# Patient Record
Sex: Female | Born: 1950 | Race: White | Hispanic: No | Marital: Married | Smoking: Former smoker
Health system: Southern US, Community
[De-identification: ages and names within clinical notes are randomized; demographics above are authoritative.]

## PROBLEM LIST (undated history)

## (undated) DIAGNOSIS — E785 Hyperlipidemia, unspecified: Secondary | ICD-10-CM

## (undated) DIAGNOSIS — H269 Unspecified cataract: Secondary | ICD-10-CM

## (undated) DIAGNOSIS — T7840XA Allergy, unspecified, initial encounter: Secondary | ICD-10-CM

## (undated) DIAGNOSIS — M1990 Unspecified osteoarthritis, unspecified site: Secondary | ICD-10-CM

## (undated) HISTORY — DX: Allergy, unspecified, initial encounter: T78.40XA

## (undated) HISTORY — PX: ABDOMINAL HYSTERECTOMY: SHX81

## (undated) HISTORY — PX: TUBAL LIGATION: SHX77

## (undated) HISTORY — DX: Hyperlipidemia, unspecified: E78.5

## (undated) HISTORY — DX: Unspecified osteoarthritis, unspecified site: M19.90

## (undated) HISTORY — DX: Unspecified cataract: H26.9

---

## 1997-11-26 ENCOUNTER — Ambulatory Visit (HOSPITAL_COMMUNITY): Admission: RE | Admit: 1997-11-26 | Discharge: 1997-11-26 | Payer: Self-pay | Admitting: Family Medicine

## 1998-12-29 ENCOUNTER — Encounter: Payer: Self-pay | Admitting: Family Medicine

## 1998-12-29 ENCOUNTER — Ambulatory Visit (HOSPITAL_COMMUNITY): Admission: RE | Admit: 1998-12-29 | Discharge: 1998-12-29 | Payer: Self-pay | Admitting: Family Medicine

## 1999-10-09 ENCOUNTER — Encounter: Payer: Self-pay | Admitting: Neurosurgery

## 1999-10-09 ENCOUNTER — Ambulatory Visit (HOSPITAL_COMMUNITY): Admission: RE | Admit: 1999-10-09 | Discharge: 1999-10-09 | Payer: Self-pay | Admitting: Neurosurgery

## 1999-10-23 ENCOUNTER — Ambulatory Visit (HOSPITAL_COMMUNITY): Admission: RE | Admit: 1999-10-23 | Discharge: 1999-10-23 | Payer: Self-pay | Admitting: Neurosurgery

## 1999-10-23 ENCOUNTER — Encounter: Payer: Self-pay | Admitting: Neurosurgery

## 2000-02-09 ENCOUNTER — Ambulatory Visit (HOSPITAL_COMMUNITY): Admission: RE | Admit: 2000-02-09 | Discharge: 2000-02-09 | Payer: Self-pay | Admitting: Family Medicine

## 2000-02-09 ENCOUNTER — Encounter: Payer: Self-pay | Admitting: Family Medicine

## 2001-02-12 ENCOUNTER — Encounter: Payer: Self-pay | Admitting: Family Medicine

## 2001-02-12 ENCOUNTER — Ambulatory Visit (HOSPITAL_COMMUNITY): Admission: RE | Admit: 2001-02-12 | Discharge: 2001-02-12 | Payer: Self-pay | Admitting: Family Medicine

## 2007-08-28 ENCOUNTER — Ambulatory Visit (HOSPITAL_COMMUNITY): Admission: RE | Admit: 2007-08-28 | Discharge: 2007-08-28 | Payer: Self-pay | Admitting: Family Medicine

## 2008-09-01 ENCOUNTER — Ambulatory Visit (HOSPITAL_COMMUNITY): Admission: RE | Admit: 2008-09-01 | Discharge: 2008-09-01 | Payer: Self-pay | Admitting: Family Medicine

## 2009-01-05 ENCOUNTER — Encounter: Admission: RE | Admit: 2009-01-05 | Discharge: 2009-01-05 | Payer: Self-pay | Admitting: Family Medicine

## 2009-01-31 ENCOUNTER — Encounter: Admission: RE | Admit: 2009-01-31 | Discharge: 2009-02-25 | Payer: Self-pay | Admitting: Family Medicine

## 2009-05-16 ENCOUNTER — Encounter: Admission: RE | Admit: 2009-05-16 | Discharge: 2009-05-16 | Payer: Self-pay | Admitting: Family Medicine

## 2009-09-12 ENCOUNTER — Ambulatory Visit (HOSPITAL_COMMUNITY): Admission: RE | Admit: 2009-09-12 | Discharge: 2009-09-12 | Payer: Self-pay | Admitting: Family Medicine

## 2010-09-04 ENCOUNTER — Other Ambulatory Visit (HOSPITAL_COMMUNITY): Payer: Self-pay | Admitting: Family Medicine

## 2010-09-04 DIAGNOSIS — Z1231 Encounter for screening mammogram for malignant neoplasm of breast: Secondary | ICD-10-CM

## 2010-09-18 ENCOUNTER — Ambulatory Visit (HOSPITAL_COMMUNITY)
Admission: RE | Admit: 2010-09-18 | Discharge: 2010-09-18 | Disposition: A | Discharge status: Home / Self Care | Payer: Commercial Managed Care - PPO | Source: Ambulatory Visit | Attending: Family Medicine | Admitting: Family Medicine

## 2010-09-18 DIAGNOSIS — Z1231 Encounter for screening mammogram for malignant neoplasm of breast: Secondary | ICD-10-CM

## 2011-10-15 ENCOUNTER — Other Ambulatory Visit (HOSPITAL_COMMUNITY): Payer: Self-pay | Admitting: Family Medicine

## 2011-10-15 DIAGNOSIS — Z1231 Encounter for screening mammogram for malignant neoplasm of breast: Secondary | ICD-10-CM

## 2011-11-02 ENCOUNTER — Ambulatory Visit (HOSPITAL_COMMUNITY)
Admission: RE | Admit: 2011-11-02 | Discharge: 2011-11-02 | Disposition: A | Discharge status: Home / Self Care | Payer: Commercial Managed Care - PPO | Source: Ambulatory Visit | Attending: Family Medicine | Admitting: Family Medicine

## 2011-11-02 DIAGNOSIS — Z1231 Encounter for screening mammogram for malignant neoplasm of breast: Secondary | ICD-10-CM

## 2012-10-24 ENCOUNTER — Other Ambulatory Visit (HOSPITAL_COMMUNITY): Payer: Self-pay | Admitting: Family Medicine

## 2012-10-24 DIAGNOSIS — Z1231 Encounter for screening mammogram for malignant neoplasm of breast: Secondary | ICD-10-CM

## 2012-11-03 ENCOUNTER — Ambulatory Visit (HOSPITAL_COMMUNITY)
Admission: RE | Admit: 2012-11-03 | Discharge: 2012-11-03 | Disposition: A | Discharge status: Home / Self Care | Payer: Commercial Managed Care - PPO | Source: Ambulatory Visit | Attending: Family Medicine | Admitting: Family Medicine

## 2012-11-03 DIAGNOSIS — Z1231 Encounter for screening mammogram for malignant neoplasm of breast: Secondary | ICD-10-CM | POA: Insufficient documentation

## 2013-11-09 ENCOUNTER — Other Ambulatory Visit (HOSPITAL_COMMUNITY): Payer: Self-pay | Admitting: Family Medicine

## 2013-11-09 DIAGNOSIS — Z1231 Encounter for screening mammogram for malignant neoplasm of breast: Secondary | ICD-10-CM

## 2013-11-25 ENCOUNTER — Ambulatory Visit (HOSPITAL_COMMUNITY)
Admission: RE | Admit: 2013-11-25 | Discharge: 2013-11-25 | Disposition: A | Discharge status: Home / Self Care | Payer: Commercial Managed Care - PPO | Source: Ambulatory Visit | Attending: Family Medicine | Admitting: Family Medicine

## 2013-11-25 DIAGNOSIS — Z1231 Encounter for screening mammogram for malignant neoplasm of breast: Secondary | ICD-10-CM | POA: Diagnosis present

## 2014-04-01 ENCOUNTER — Ambulatory Visit: Payer: Commercial Managed Care - PPO | Attending: Family Medicine | Admitting: Physical Therapy

## 2014-04-01 DIAGNOSIS — M25561 Pain in right knee: Secondary | ICD-10-CM | POA: Insufficient documentation

## 2014-04-15 ENCOUNTER — Ambulatory Visit: Payer: Commercial Managed Care - PPO | Admitting: Physical Therapy

## 2014-04-15 DIAGNOSIS — M25561 Pain in right knee: Secondary | ICD-10-CM | POA: Diagnosis not present

## 2014-04-29 ENCOUNTER — Ambulatory Visit: Payer: Commercial Managed Care - PPO | Attending: Family Medicine | Admitting: Physical Therapy

## 2014-04-29 DIAGNOSIS — M25561 Pain in right knee: Secondary | ICD-10-CM | POA: Diagnosis present

## 2014-05-13 ENCOUNTER — Encounter: Payer: Self-pay | Admitting: Physical Therapy

## 2014-05-13 ENCOUNTER — Ambulatory Visit: Payer: Commercial Managed Care - PPO | Admitting: Physical Therapy

## 2014-05-13 DIAGNOSIS — M25561 Pain in right knee: Principal | ICD-10-CM

## 2014-05-13 DIAGNOSIS — G8929 Other chronic pain: Secondary | ICD-10-CM

## 2014-05-13 NOTE — Therapy (Addendum)
[facility] [address] [place], [place], [place], [address] Phone: [phone]   Fax:  [phone]  Physical Therapy Treatment  Patient Details  Name: Joy Murillo MRN: [id number] Date of Birth: 03/14/1951 Referring Provider:  Jonathon Bellows, MD  Encounter Date: 05/13/2014      PT End of Session - 05/13/14 0912    Visit Number 4   Number of Visits 17   Date for PT Re-Evaluation 05/27/14      History reviewed. No pertinent past medical history.  History reviewed. No pertinent past surgical history.  There were no vitals taken for this visit.  Visit Diagnosis:  Knee pain, chronic, right      Subjective Assessment - 05/13/14 0821    Symptoms Rfight knee pain   Limitations Standing;House hold activities   How long can you stand comfortably? 2 hours   How long can you walk comfortably? downhill, descending stairs   Currently in Pain? Yes   Pain Score 2    Pain Location Knee   Pain Orientation Right   Pain Descriptors / Indicators Sore   Pain Type Chronic pain   Pain Onset More than a month ago   Pain Frequency Intermittent   Aggravating Factors  descending stairs, walking downhill, cleaning the house, playing piano   Pain Relieving Factors changing position   Multiple Pain Sites No                    [facility] Adult PT Treatment/Exercise - 05/13/14 0001    Exercises   Exercises Knee/Hip   Knee/Hip Exercises: Stretches   Active Hamstring Stretch 3 reps;20 seconds   Knee/Hip Exercises: Aerobic   Stationary Bike 38mn  level 2, pt with slight incr. of discomfort at end of task   Knee/Hip Exercises: Standing   Knee Flexion AROM   Modalities   Modalities Ultrasound;Iontophoresis   Ultrasound   Ultrasound Location right knee   Ultrasound Parameters 20% x 8 min   Ultrasound Goals Pain   Iontophoresis   Type of Iontophoresis Dexamethasone   Location right knee   Dose 1cc   Time 6hour stat  patch   Manual Therapy   Manual Therapy Other (comment)  cross friction to right patellar tendon, and patella mob                PT Education - 05/13/14 0931    Education provided Yes          PT Short Term Goals - 05/13/14 0923    PT SHORT TERM GOAL #1   Title be independent with initial HEP for patella mobilization and flexibility exercises   Time 3   Period Weeks   Status Achieved   PT SHORT TERM GOAL #2   Title ambulate with pain decreased >/25%   Time 3   Period Weeks   Status On-going   PT SHORT TERM GOAL #3   Title kneeling with pain decreased >/=25%   Time 3   Period Weeks   Status On-going   PT SHORT TERM GOAL #4   Title walking down hill with pain decreased ./=25%   Time 3   Status On-going           PT Long Term Goals - 05/13/14 02778   PT LONG TERM GOAL #1   Title demonstrate and/or verbalize techniques to reduce the risk of re-injury to include info on:   Time 8   Period Weeks   Status  On-going   PT LONG TERM GOAL #2   Title be independent with advanced HEP for knee strengthening   Time 8   Period Weeks   Status On-going   PT LONG TERM GOAL #3   Title ambulate with pain decrease>=50%   Time 8   Period Weeks   Status On-going   PT LONG TERM GOAL #4   Title kneeling with pain decrease >/=50%   Time 8   Period Weeks   Status On-going   PT LONG TERM GOAL #5   Title walking down hill with pain decrease >/= 50%   Time 8   Period Weeks   Status On-going               Plan - 05/13/14 1586    Clinical Impression Statement patient will continue to benefit from skilled PT   Pt will benefit from skilled therapeutic intervention in order to improve on the following deficits Difficulty walking;Impaired flexibility;Pain;Decreased strength;Decreased activity tolerance   Rehab Potential Good   PT Frequency 2x / week   PT Duration 8 weeks   PT Treatment/Interventions Therapeutic activities;Patient/family education;Passive range of  motion;Therapeutic exercise;Ultrasound;Manual techniques;Stair training;Neuromuscular re-education   PT Next Visit Plan Quadriceps, Gastrocnemius, HS stretch, Ultrasound, Manuall therapy, endurance and strength, Ionto   Consulted and Agree with Plan of Care Patient        Problem List There are no active problems to display for this patient.   NAUMANN-HOUEGNIFIO,Nelton Amsden PTA 05/13/2014, 5:22 PM PHYSICAL THERAPY DISCHARGE SUMMARY  Visits from Start of Care: 4  Current functional level related to goals / functional outcomes: Pt attended 4 PT sessions and didn't return to PT.     Remaining deficits: Current status unknown.  See above for status 05/13/14.     Education / Equipment: HEP Plan: Patient agrees to discharge.  Patient goals were partially met. Patient is being discharged due to not returning since the last visit.  ?????    Sigurd Sos, Abby 02/15/2015 9:04 AM [facility] [address] [place], [place], [place], [address] Phone: [phone]   Fax:  [phone]

## 2014-05-13 NOTE — Patient Instructions (Signed)

## 2014-05-26 ENCOUNTER — Ambulatory Visit (INDEPENDENT_AMBULATORY_CARE_PROVIDER_SITE_OTHER): Payer: Commercial Managed Care - PPO

## 2014-05-26 ENCOUNTER — Ambulatory Visit (INDEPENDENT_AMBULATORY_CARE_PROVIDER_SITE_OTHER): Payer: Commercial Managed Care - PPO | Admitting: Family Medicine

## 2014-05-26 VITALS — BP 126/80 | HR 89 | Temp 98.0°F | Resp 17 | Ht 65.5 in | Wt 149.0 lb

## 2014-05-26 DIAGNOSIS — M25561 Pain in right knee: Secondary | ICD-10-CM

## 2014-05-26 MED ORDER — DICLOFENAC SODIUM 75 MG PO TBEC: 75.0000 mg | DELAYED_RELEASE_TABLET | Freq: Two times a day (BID) | ORAL | Status: DC

## 2014-05-26 NOTE — Patient Instructions (Signed)
I will get you set up for an MRI of your right knee asap.  When these results are in we will plan your next step.   Use the voltaren (NSAID) medication as needed for pain. You can also use tylenol.  Ice and elevate your leg, and use the crutches and brace as needed to take the pressure off of your knee.  Let me know if you have any other concerns in the meantime

## 2014-05-26 NOTE — Progress Notes (Signed)
[facility] [address], [place] [address] [phone]  Date:  05/26/2014   Name:  Joy Murillo   DOB:  01/26/1951   MRN:  [id number]  PCP:  Jonathon Bellows, MD    Chief Complaint: Knee Pain   History of Present Illness:  Joy Murillo is a 64 y.o. very pleasant female patient who presents with the following:  Generally healthy lady here as a new patient today.   Right before christmas she started having pain in her right knee with walking downhill.  After gym work-outs she would also have pain. This got worse with time. She saw her MD at [facility]; it sounds like she was dx with patellofemoral pain and started PT.   She then followed- up with her PCP.  She has an appt to see Dr. Everlene Farrier next week, but last night she was going down some steps and felt a sudden pain and a "snap."  Her knee has hurt a lot more since she injured it yesterday.  She is having a hard time walking.   It is a bit swollen.   She also has some back problems- her knee is making her back hurt more.   She took some advil last night  There are no active problems to display for this patient.   Past Medical History  Diagnosis Date  . Allergy     History reviewed. No pertinent past surgical history.  History  Substance Use Topics  . Smoking status: Former Research scientist (life sciences)  . Smokeless tobacco: Not on file  . Alcohol Use: No    Family History  Problem Relation Age of Onset  . Heart disease Mother   . Cancer Father   . Diabetes Father   . Heart disease Maternal Grandfather   . Cancer Paternal Grandmother   . Cancer Paternal Grandfather     Allergies  Allergen Reactions  . Adhesive [Tape]   . Flonase [Fluticasone Propionate] Hives    Medication list has been reviewed and updated.  Current Outpatient Prescriptions on File Prior to Visit  Medication Sig Dispense Refill  . estradiol (ESTRACE) 0.5 MG tablet Take 0.5 mg by mouth daily.     No current facility-administered  medications on file prior to visit.    Review of Systems:  As per HPI- otherwise negative.   Physical Examination: Filed Vitals:   05/26/14 0855  BP: 126/80  Pulse: 89  Temp: 98 F (36.7 C)  Resp: 17   Filed Vitals:   05/26/14 0855  Height: 5' 5.5" (1.664 m)  Weight: 149 lb (67.586 kg)   Body mass index is 24.41 kg/(m^2). Ideal Body Weight: Weight in (lb) to have BMI = 25: 152.2  GEN: WDWN, NAD, Non-toxic, A & O x 3, looks well HEENT: Atraumatic, Normocephalic. Neck supple. No masses, No LAD. Ears and Nose: No external deformity. CV: RRR, No M/G/R. No JVD. No thrill. No extra heart sounds. PULM: CTA B, no wheezes, crackles, rhonchi. No retractions. No resp. distress. No accessory muscle use.Marland Kitchen EXTR: No c/c/e NEURO favoring right leg slightly PSYCH: Normally interactive. Conversant. Not depressed or anxious appearing.  Calm demeanor.  Right knee: tender over the medial joint line.  No effusion,  crepitus with flexion and extension but full ROM.  Ligaments are stable, no heat or redness  [facility] reading (PRIMARY) by  Dr. Lorelei Pont. Right knee: medial compartment degenerative change, OW negative RIGHT KNEE - COMPLETE 4+ VIEW  COMPARISON: None.  FINDINGS: Mild  medial compartment and patellofemoral degenerative change. No evidence of fracture or dislocation. Knee joint effusion may be present.  IMPRESSION: 1. Right knee joint effusion.  2. Medial compartment and patellofemoral degenerative change. No acute bony abnormality.  Given crutches and a hinged knee brace- the brace felt good to her Assessment and Plan: Right knee pain - Plan: DG Knee Complete 4 Views Right, diclofenac (VOLTAREN) 75 MG EC tablet, MR Knee Right Wo Contrast  Knee pain for over 3 months.  She has tried conservative therapy/ PT but continues to get worse.  Suspect meniscal tear.  Will refer for MRI at this point, then will likely send to ortho.   voltaren as needed for pain, crutches and hinged  brace See patient instructions for more details.     Signed Lamar Blinks, MD

## 2014-05-27 ENCOUNTER — Encounter: Payer: Commercial Managed Care - PPO | Admitting: Physical Therapy

## 2014-05-28 ENCOUNTER — Telehealth: Payer: Self-pay | Admitting: Family Medicine

## 2014-05-28 DIAGNOSIS — M25561 Pain in right knee: Secondary | ICD-10-CM

## 2014-05-28 MED ORDER — TRAMADOL HCL 50 MG PO TABS: 50.0000 mg | ORAL_TABLET | Freq: Three times a day (TID) | ORAL | Status: DC | PRN

## 2014-05-28 NOTE — Telephone Encounter (Signed)
Patient is requesting Tramadol. She states that it was discussed during her OV. [place]   [phone]

## 2014-06-01 ENCOUNTER — Ambulatory Visit
Admission: RE | Admit: 2014-06-01 | Discharge: 2014-06-01 | Disposition: A | Discharge status: Home / Self Care | Payer: Commercial Managed Care - PPO | Source: Ambulatory Visit | Attending: Family Medicine | Admitting: Family Medicine

## 2014-06-01 ENCOUNTER — Telehealth: Payer: Self-pay | Admitting: Family Medicine

## 2014-06-01 ENCOUNTER — Ambulatory Visit: Payer: Self-pay | Admitting: Emergency Medicine

## 2014-06-01 DIAGNOSIS — M25561 Pain in right knee: Secondary | ICD-10-CM

## 2014-06-01 DIAGNOSIS — S83206A Unspecified tear of unspecified meniscus, current injury, right knee, initial encounter: Secondary | ICD-10-CM

## 2014-06-01 NOTE — Telephone Encounter (Signed)
Called her and discussed her MRI with her.  She does have a meniscal tear. Will refer to ortho- she has been seen at [facility] for this- she is ok with this plan.  She is able to get around ok with her hinged knee brace

## 2015-01-03 ENCOUNTER — Other Ambulatory Visit: Payer: Self-pay

## 2015-01-03 DIAGNOSIS — Z1231 Encounter for screening mammogram for malignant neoplasm of breast: Secondary | ICD-10-CM

## 2015-01-10 ENCOUNTER — Ambulatory Visit
Admission: RE | Admit: 2015-01-10 | Discharge: 2015-01-10 | Disposition: A | Discharge status: Home / Self Care | Payer: 59 | Source: Ambulatory Visit

## 2015-01-10 DIAGNOSIS — Z1231 Encounter for screening mammogram for malignant neoplasm of breast: Secondary | ICD-10-CM

## 2016-01-05 ENCOUNTER — Other Ambulatory Visit: Payer: Self-pay | Admitting: Family Medicine

## 2016-01-05 DIAGNOSIS — Z1231 Encounter for screening mammogram for malignant neoplasm of breast: Secondary | ICD-10-CM

## 2016-01-11 ENCOUNTER — Ambulatory Visit
Admission: RE | Admit: 2016-01-11 | Discharge: 2016-01-11 | Disposition: A | Discharge status: Home / Self Care | Payer: BLUE CROSS/BLUE SHIELD | Source: Ambulatory Visit | Attending: Family Medicine | Admitting: Family Medicine

## 2016-01-11 DIAGNOSIS — Z1231 Encounter for screening mammogram for malignant neoplasm of breast: Secondary | ICD-10-CM

## 2016-02-17 IMAGING — MR MR KNEE*R* W/O CM
4 of 6 series · 19 of 40 positions shown · non-contrast
Comparison: 05/26/2014

CLINICAL DATA: Right knee pain for several years but increased over
the past 3 months. Popping injury 1 week ago while walking down
stairs.

EXAM:
MRI OF THE RIGHT KNEE WITHOUT CONTRAST
TECHNIQUE: Multiplanar, multisequence MR imaging of the knee was performed. No
intravenous contrast was administered.

[Series 3: PD fat-sat · axial · 4.0mm · 0.29mm/px · z∈[-64,+66]mm · 8 of 27 slices shown (1 of 4)]
[im 1/27]
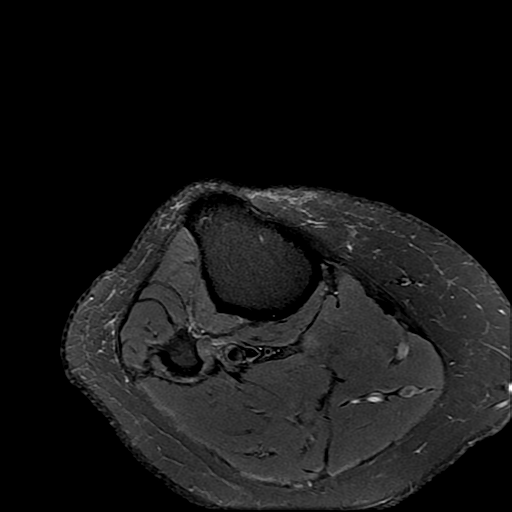
[im 4/27]
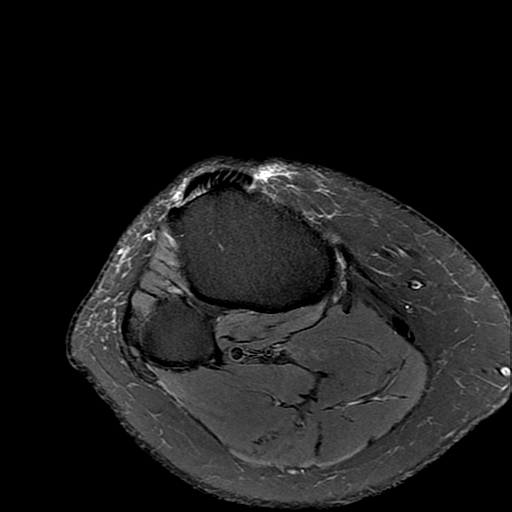
[im 8/27]
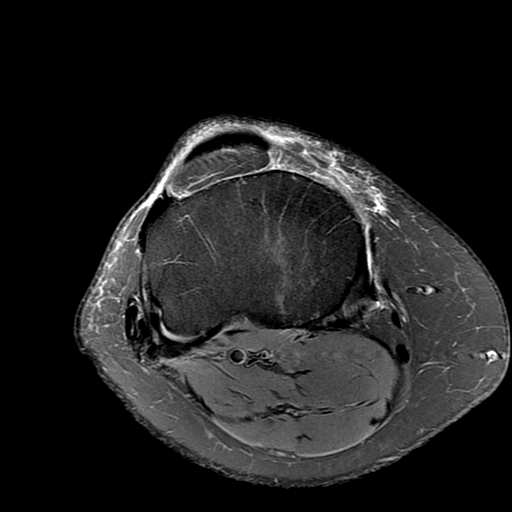
[im 12/27]
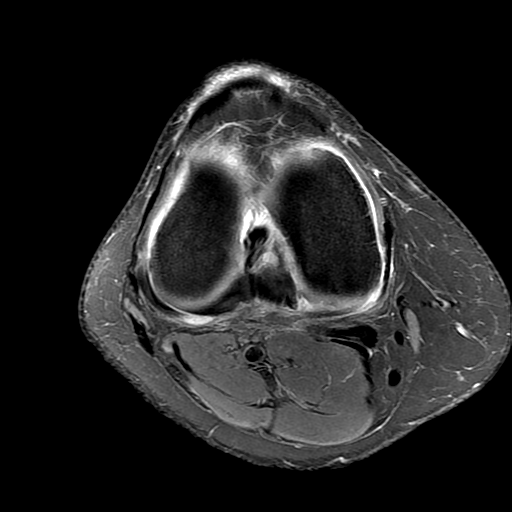
[im 15/27]
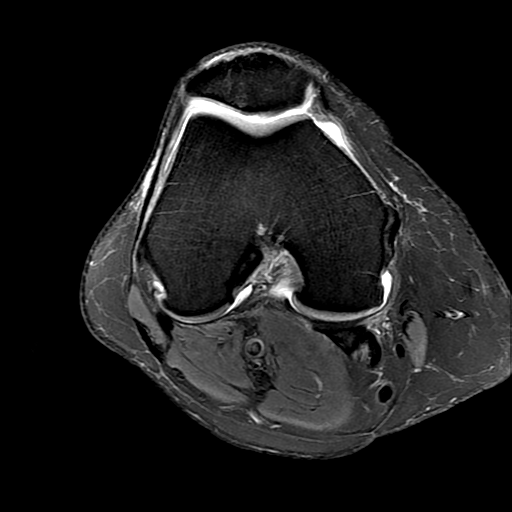
[im 19/27]
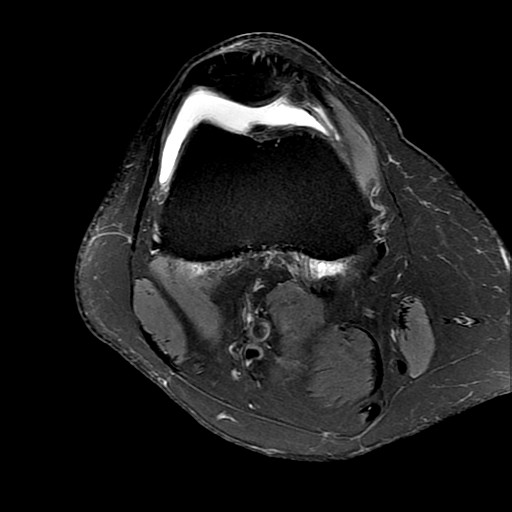
[im 23/27]
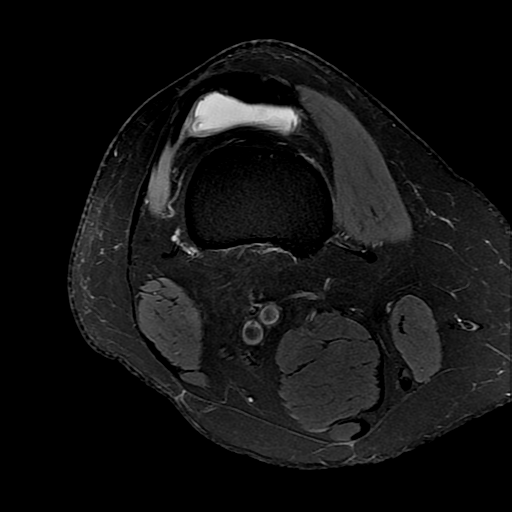
[im 27/27]
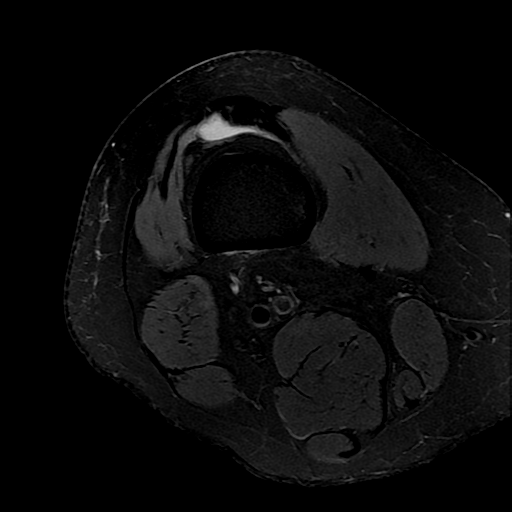

[Series 4: PD fat-sat · sagittal · 3.0mm · 0.27mm/px · 5 of 25 slices shown (2 of 4)]
[im 1/25]
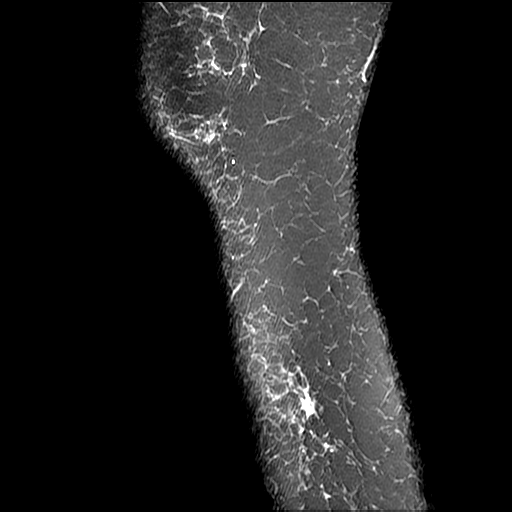
[im 5/25]
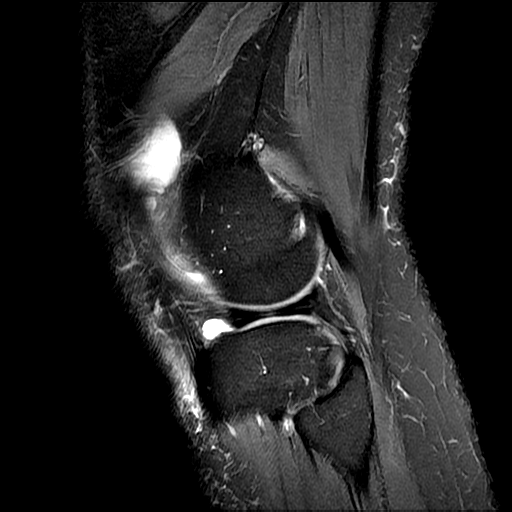
[im 9/25]
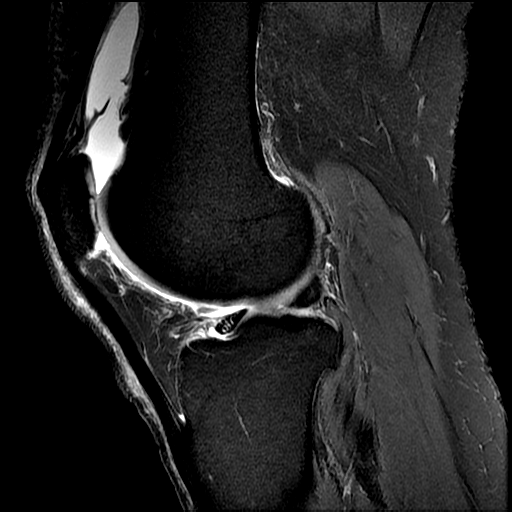
[im 13/25]
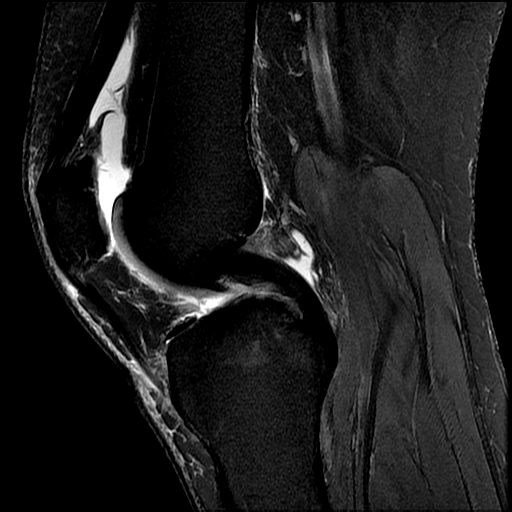
[im 21/25]
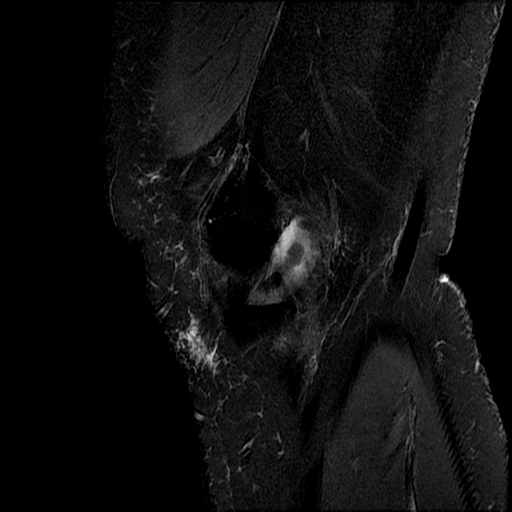

[Series 7: PD fat-sat · coronal · 4.0mm · 0.29mm/px · 3 of 23 slices shown (3 of 4)]
[im 4/23]
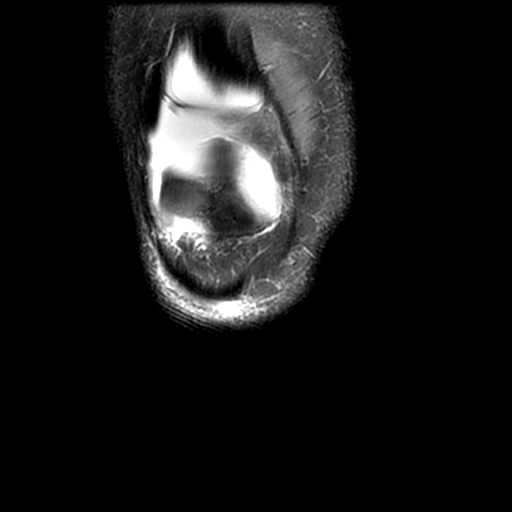
[im 12/23]
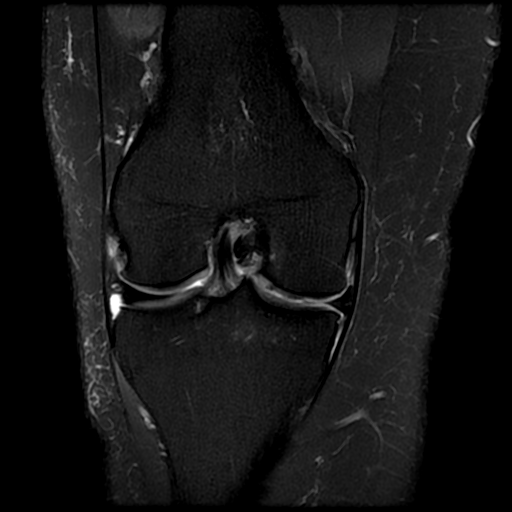
[im 19/23]
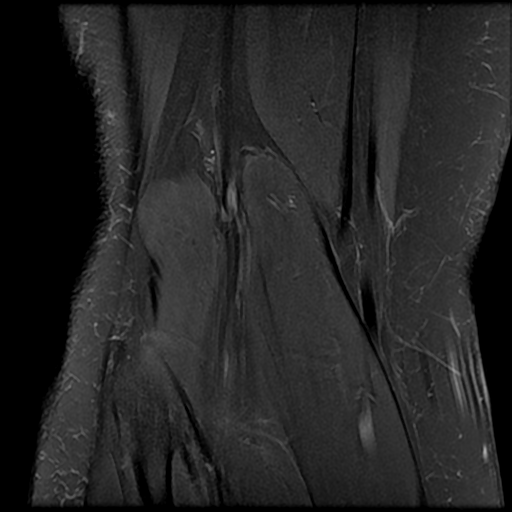

[Series 8: PD fat-sat · coronal · 2.0mm · 0.31mm/px · 3 of 13 slices shown (4 of 4)]
[im 1/13]
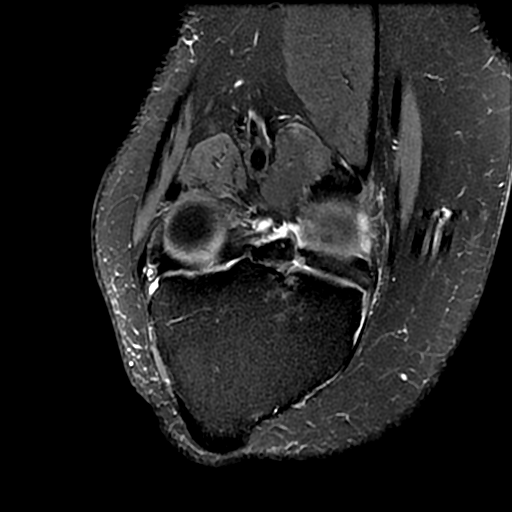
[im 9/13]
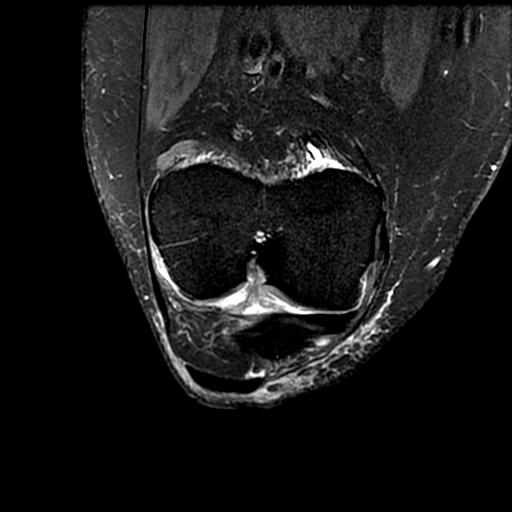
[im 13/13]
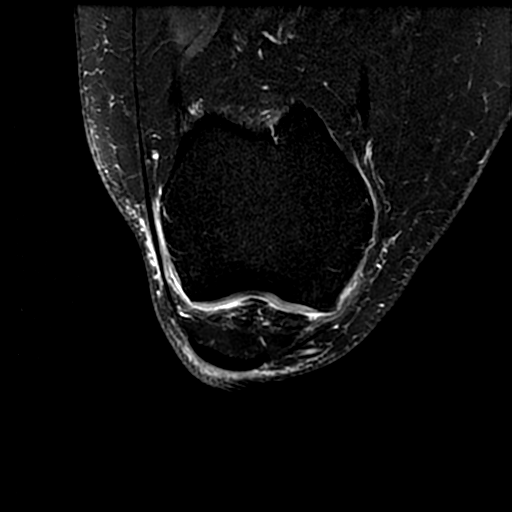

[19 of 40 positions shown; findings below may reference images not displayed]

FINDINGS: MENISCI

Medial meniscus:  Radial tear of the root of the posterior horn.

Lateral meniscus: Small radial tear in the posterior horn as on
image 8 of series 4, with a small free edge flap. Discoid variant.

LIGAMENTS

Cruciates:  Unremarkable

Collaterals:  Subtle edema tracks superficial to the MCL.

CARTILAGE

Patellofemoral: Nearly full-thickness chondral irregularity and
articular cartilage loss is specially along the posterior patellar
ridge, with moderate chondral thinning elsewhere along the facets.
There is only mild chondral thinning in the femoral trochlear
groove.

Medial: Moderate degenerative chondral thinning and considerable
chondral irregularity especially along the medial femoral condyle.

Lateral: Generally mild chondral thinning although there are some
moderate areas of chondral thinning posteriorly along the lateral
tibial plateau. Mild chondral irregularity.

Joint:  Moderate knee effusion.  Superior plica.

Popliteal Fossa:  Unremarkable

Extensor Mechanism:  Unremarkable

Bones:  Minimal marginal spurring in the medial compartment.
IMPRESSION: 1. Radial tear of the root of the posterior horn medial meniscus.
2. Small radial tear in the posterior horn lateral meniscus.
3. Potential grade 1 sprain of the MCL.
4. Varying degrees of chondral irregularity and chondral thinning,
most striking along the posterior patellar ridge and in the medial
compartment.
5. Moderate knee effusion with superior plica.

## 2016-12-10 ENCOUNTER — Other Ambulatory Visit: Payer: Self-pay | Admitting: Family Medicine

## 2016-12-10 DIAGNOSIS — Z1231 Encounter for screening mammogram for malignant neoplasm of breast: Secondary | ICD-10-CM

## 2017-01-14 ENCOUNTER — Ambulatory Visit
Admission: RE | Admit: 2017-01-14 | Discharge: 2017-01-14 | Disposition: A | Discharge status: Home / Self Care | Payer: BLUE CROSS/BLUE SHIELD | Source: Ambulatory Visit | Attending: Family Medicine | Admitting: Family Medicine

## 2017-01-14 DIAGNOSIS — Z1231 Encounter for screening mammogram for malignant neoplasm of breast: Secondary | ICD-10-CM

## 2017-12-05 ENCOUNTER — Other Ambulatory Visit: Payer: Self-pay | Admitting: Family Medicine

## 2017-12-05 DIAGNOSIS — Z1231 Encounter for screening mammogram for malignant neoplasm of breast: Secondary | ICD-10-CM

## 2018-01-17 ENCOUNTER — Ambulatory Visit
Admission: RE | Admit: 2018-01-17 | Discharge: 2018-01-17 | Disposition: A | Discharge status: Home / Self Care | Payer: BLUE CROSS/BLUE SHIELD | Source: Ambulatory Visit | Attending: Family Medicine | Admitting: Family Medicine

## 2018-01-17 DIAGNOSIS — Z1231 Encounter for screening mammogram for malignant neoplasm of breast: Secondary | ICD-10-CM

## 2018-03-12 DIAGNOSIS — J069 Acute upper respiratory infection, unspecified: Secondary | ICD-10-CM | POA: Diagnosis not present

## 2018-08-07 ENCOUNTER — Other Ambulatory Visit: Payer: Self-pay

## 2018-08-07 NOTE — Patient Outreach (Signed)
[facility]) Care Management  08/07/2018  Joy Murillo [birth year] [id number]   Health Risk Assessment (Engaged Patient) Health Risk Assessment screening completed by nursing staff. Patient referred to [facility] for outreach in 6 months.   PLAN Will follow up in 6 months.   [place] [phone]

## 2018-09-12 DIAGNOSIS — N949 Unspecified condition associated with female genital organs and menstrual cycle: Secondary | ICD-10-CM | POA: Diagnosis not present

## 2018-09-12 DIAGNOSIS — R399 Unspecified symptoms and signs involving the genitourinary system: Secondary | ICD-10-CM | POA: Diagnosis not present

## 2018-12-02 DIAGNOSIS — Z23 Encounter for immunization: Secondary | ICD-10-CM | POA: Diagnosis not present

## 2018-12-02 DIAGNOSIS — Z5181 Encounter for therapeutic drug level monitoring: Secondary | ICD-10-CM | POA: Diagnosis not present

## 2018-12-02 DIAGNOSIS — E785 Hyperlipidemia, unspecified: Secondary | ICD-10-CM | POA: Diagnosis not present

## 2018-12-02 DIAGNOSIS — Z0000 Encounter for general adult medical examination without abnormal findings: Secondary | ICD-10-CM | POA: Diagnosis not present

## 2018-12-05 DIAGNOSIS — Z23 Encounter for immunization: Secondary | ICD-10-CM | POA: Diagnosis not present

## 2018-12-29 DIAGNOSIS — L821 Other seborrheic keratosis: Secondary | ICD-10-CM | POA: Diagnosis not present

## 2018-12-29 DIAGNOSIS — D2262 Melanocytic nevi of left upper limb, including shoulder: Secondary | ICD-10-CM | POA: Diagnosis not present

## 2018-12-29 DIAGNOSIS — L814 Other melanin hyperpigmentation: Secondary | ICD-10-CM | POA: Diagnosis not present

## 2018-12-29 DIAGNOSIS — D225 Melanocytic nevi of trunk: Secondary | ICD-10-CM | POA: Diagnosis not present

## 2018-12-29 DIAGNOSIS — Z23 Encounter for immunization: Secondary | ICD-10-CM | POA: Diagnosis not present

## 2018-12-29 DIAGNOSIS — M7130 Other bursal cyst, unspecified site: Secondary | ICD-10-CM | POA: Diagnosis not present

## 2018-12-29 DIAGNOSIS — Z86018 Personal history of other benign neoplasm: Secondary | ICD-10-CM | POA: Diagnosis not present

## 2019-02-06 ENCOUNTER — Other Ambulatory Visit: Payer: Self-pay

## 2019-02-06 NOTE — Patient Outreach (Signed)
This encounter was created in error - please disregard.

## 2019-03-03 DIAGNOSIS — N958 Other specified menopausal and perimenopausal disorders: Secondary | ICD-10-CM | POA: Diagnosis not present

## 2019-03-03 DIAGNOSIS — R351 Nocturia: Secondary | ICD-10-CM | POA: Diagnosis not present

## 2019-03-03 DIAGNOSIS — N952 Postmenopausal atrophic vaginitis: Secondary | ICD-10-CM | POA: Diagnosis not present

## 2019-03-03 DIAGNOSIS — R82998 Other abnormal findings in urine: Secondary | ICD-10-CM | POA: Diagnosis not present

## 2019-04-02 ENCOUNTER — Other Ambulatory Visit: Payer: Self-pay | Admitting: Family Medicine

## 2019-04-02 DIAGNOSIS — Z1231 Encounter for screening mammogram for malignant neoplasm of breast: Secondary | ICD-10-CM

## 2019-04-07 ENCOUNTER — Other Ambulatory Visit: Payer: Self-pay

## 2019-04-07 ENCOUNTER — Ambulatory Visit
Admission: RE | Admit: 2019-04-07 | Discharge: 2019-04-07 | Disposition: A | Discharge status: Home / Self Care | Payer: PPO | Source: Ambulatory Visit

## 2019-04-07 DIAGNOSIS — Z1231 Encounter for screening mammogram for malignant neoplasm of breast: Secondary | ICD-10-CM | POA: Diagnosis not present

## 2019-04-22 DIAGNOSIS — N941 Unspecified dyspareunia: Secondary | ICD-10-CM | POA: Diagnosis not present

## 2019-04-22 DIAGNOSIS — R351 Nocturia: Secondary | ICD-10-CM | POA: Diagnosis not present

## 2019-04-22 DIAGNOSIS — N952 Postmenopausal atrophic vaginitis: Secondary | ICD-10-CM | POA: Diagnosis not present

## 2019-04-22 DIAGNOSIS — N958 Other specified menopausal and perimenopausal disorders: Secondary | ICD-10-CM | POA: Diagnosis not present

## 2019-10-02 DIAGNOSIS — H02889 Meibomian gland dysfunction of unspecified eye, unspecified eyelid: Secondary | ICD-10-CM | POA: Diagnosis not present

## 2019-10-02 DIAGNOSIS — H3582 Retinal ischemia: Secondary | ICD-10-CM | POA: Diagnosis not present

## 2019-10-02 DIAGNOSIS — H2513 Age-related nuclear cataract, bilateral: Secondary | ICD-10-CM | POA: Diagnosis not present

## 2019-12-10 DIAGNOSIS — M1990 Unspecified osteoarthritis, unspecified site: Secondary | ICD-10-CM | POA: Diagnosis not present

## 2019-12-10 DIAGNOSIS — J309 Allergic rhinitis, unspecified: Secondary | ICD-10-CM | POA: Diagnosis not present

## 2019-12-10 DIAGNOSIS — Z23 Encounter for immunization: Secondary | ICD-10-CM | POA: Diagnosis not present

## 2019-12-10 DIAGNOSIS — Z0000 Encounter for general adult medical examination without abnormal findings: Secondary | ICD-10-CM | POA: Diagnosis not present

## 2019-12-10 DIAGNOSIS — Z8719 Personal history of other diseases of the digestive system: Secondary | ICD-10-CM | POA: Diagnosis not present

## 2019-12-10 DIAGNOSIS — N952 Postmenopausal atrophic vaginitis: Secondary | ICD-10-CM | POA: Diagnosis not present

## 2019-12-10 DIAGNOSIS — K5289 Other specified noninfective gastroenteritis and colitis: Secondary | ICD-10-CM | POA: Diagnosis not present

## 2019-12-10 DIAGNOSIS — E739 Lactose intolerance, unspecified: Secondary | ICD-10-CM | POA: Diagnosis not present

## 2019-12-10 DIAGNOSIS — Z5181 Encounter for therapeutic drug level monitoring: Secondary | ICD-10-CM | POA: Diagnosis not present

## 2019-12-10 DIAGNOSIS — E785 Hyperlipidemia, unspecified: Secondary | ICD-10-CM | POA: Diagnosis not present

## 2020-01-07 DIAGNOSIS — Z1159 Encounter for screening for other viral diseases: Secondary | ICD-10-CM | POA: Diagnosis not present

## 2020-01-11 DIAGNOSIS — Z86018 Personal history of other benign neoplasm: Secondary | ICD-10-CM | POA: Diagnosis not present

## 2020-01-11 DIAGNOSIS — D225 Melanocytic nevi of trunk: Secondary | ICD-10-CM | POA: Diagnosis not present

## 2020-01-11 DIAGNOSIS — L814 Other melanin hyperpigmentation: Secondary | ICD-10-CM | POA: Diagnosis not present

## 2020-01-11 DIAGNOSIS — L578 Other skin changes due to chronic exposure to nonionizing radiation: Secondary | ICD-10-CM | POA: Diagnosis not present

## 2020-01-11 DIAGNOSIS — D2262 Melanocytic nevi of left upper limb, including shoulder: Secondary | ICD-10-CM | POA: Diagnosis not present

## 2020-01-11 DIAGNOSIS — L821 Other seborrheic keratosis: Secondary | ICD-10-CM | POA: Diagnosis not present

## 2020-01-13 DIAGNOSIS — K5730 Diverticulosis of large intestine without perforation or abscess without bleeding: Secondary | ICD-10-CM | POA: Diagnosis not present

## 2020-01-13 DIAGNOSIS — Z86010 Personal history of colonic polyps: Secondary | ICD-10-CM | POA: Diagnosis not present

## 2020-01-26 ENCOUNTER — Other Ambulatory Visit: Payer: Self-pay

## 2020-01-26 ENCOUNTER — Ambulatory Visit (INDEPENDENT_AMBULATORY_CARE_PROVIDER_SITE_OTHER): Payer: PPO

## 2020-01-26 ENCOUNTER — Ambulatory Visit: Payer: Self-pay

## 2020-01-26 ENCOUNTER — Ambulatory Visit: Payer: PPO | Admitting: Family Medicine

## 2020-01-26 ENCOUNTER — Encounter: Payer: Self-pay | Admitting: Family Medicine

## 2020-01-26 VITALS — BP 124/86 | HR 60 | Ht 65.5 in | Wt 144.0 lb

## 2020-01-26 DIAGNOSIS — M25561 Pain in right knee: Secondary | ICD-10-CM | POA: Diagnosis not present

## 2020-01-26 DIAGNOSIS — M1711 Unilateral primary osteoarthritis, right knee: Secondary | ICD-10-CM | POA: Diagnosis not present

## 2020-01-26 NOTE — Progress Notes (Signed)
[facility] [place] Phone: [phone] Subjective:   Fontaine No, am serving as a scribe for Dr. Hulan Saas. This visit occurred during the SARS-CoV-2 public health emergency.  Safety protocols were in place, including screening questions prior to the visit, additional usage of staff PPE, and extensive cleaning of exam room while observing appropriate contact time as indicated for disinfecting solutions.   I'm seeing this patient by the request  of:  Maurice Small, MD  CC: Right knee pain  [id number]  Joy Murillo is a 69 y.o. female coming in with complaint of right anterior knee pain for years. Patient uses knee sleeve when walking. At night she props knee on pillow. Patient will have pain with kneeling on knee. History of arthroscopic surgery 2016.   MRI right knee 2016 IMPRESSION: 1. Radial tear of the root of the posterior horn medial meniscus. 2. Small radial tear in the posterior horn lateral meniscus. 3. Potential grade 1 sprain of the MCL. 4. Varying degrees of chondral irregularity and chondral thinning, most striking along the posterior patellar ridge and in the medial compartment. 5. Moderate knee effusion with superior plica.       Past Medical History:  Diagnosis Date  . Allergy    No past surgical history on file. Social History   Socioeconomic History  . Marital status: Married    Spouse name: Not on file  . Number of children: Not on file  . Years of education: Not on file  . Highest education level: Not on file  Occupational History  . Not on file  Tobacco Use  . Smoking status: Former Smoker  Substance and Sexual Activity  . Alcohol use: No  . Drug use: No  . Sexual activity: Never    Birth control/protection: Abstinence  Other Topics Concern  . Not on file  Social History Narrative  . Not on file   Social Determinants of Health   Financial Resource Strain:    . Difficulty of Paying Living Expenses: Not on file  Food Insecurity:   . Worried About Charity fundraiser in the Last Year: Not on file  . Ran Out of Food in the Last Year: Not on file  Transportation Needs:   . Lack of Transportation (Medical): Not on file  . Lack of Transportation (Non-Medical): Not on file  Physical Activity:   . Days of Exercise per Week: Not on file  . Minutes of Exercise per Session: Not on file  Stress:   . Feeling of Stress : Not on file  Social Connections:   . Frequency of Communication with Friends and Family: Not on file  . Frequency of Social Gatherings with Friends and Family: Not on file  . Attends Religious Services: Not on file  . Active Member of Clubs or Organizations: Not on file  . Attends Archivist Meetings: Not on file  . Marital Status: Not on file   Allergies  Allergen Reactions  . Adhesive [Tape]   . Flonase [Fluticasone Propionate] Hives   Family History  Problem Relation Age of Onset  . Heart disease Mother   . Cancer Father   . Diabetes Father   . Heart disease Maternal Grandfather   . Cancer Paternal Grandmother   . Cancer Paternal Grandfather   . Breast cancer Neg Hx     Current Outpatient Medications (Endocrine & Metabolic):  .  estradiol (ESTRACE) 0.5 MG tablet, Take 0.5  mg by mouth daily.    Current Outpatient Medications (Analgesics):  .  aspirin 81 MG tablet, Take 81 mg by mouth daily. .  diclofenac (VOLTAREN) 75 MG EC tablet, Take 1 tablet (75 mg total) by mouth 2 (two) times daily. Use as needed for knee pain .  traMADol (ULTRAM) 50 MG tablet, Take 1 tablet (50 mg total) by mouth every 8 (eight) hours as needed.   Current Outpatient Medications (Other):  Marland Kitchen  Multiple Vitamins-Minerals (MULTIVITAMIN WITH MINERALS) tablet, Take 1 tablet by mouth daily.   Reviewed prior external information including notes and imaging from  primary care provider As well as notes that were available from care  everywhere and other healthcare systems.  Past medical history, social, surgical and family history all reviewed in electronic medical record.  No pertanent information unless stated regarding to the chief complaint.   Review of Systems:  No headache, visual changes, nausea, vomiting, diarrhea, constipation, dizziness, abdominal pain, skin rash, fevers, chills, night sweats, weight loss, swollen lymph nodes, body aches, joint swelling, chest pain, shortness of breath, mood changes. POSITIVE muscle aches  Objective  Blood pressure 124/86, pulse 60, height 5' 5.5" (1.664 m), weight 144 lb (65.3 kg), SpO2 99 %.   General: No apparent distress alert and oriented x3 mood and affect normal, dressed appropriately.  HEENT: Pupils equal, extraocular movements intact  Respiratory: Patient's speak in full sentences and does not appear short of breath  Cardiovascular: No lower extremity edema, non tender, no erythema  Right knee exam shows the patient does have some mild lateral tracking of the patella noted.  Mild positive patellar grind.  No significant instability of the knee noted though.  Very mild pain over the medial joint space.  All ligaments appear to be intact.  Limited musculoskeletal ultrasound was performed and interpreted by Lyndal Pulley Limited ultrasound of patient's right knee shows the patient does have moderate narrowing of the patellofemoral joint noted.  Trace effusion noted at this place.  Patient does have the postsurgical changes of the medial meniscus posteriorly.  Patient does have some degenerative changes of the meniscus still left.  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  This included exercises focusing on stretching, strengthening, with significant focus on eccentric aspects.   Long  term goals include an improvement in range of motion, strength, endurance as well as avoiding reinjury. Patient's frequency would include in 1-2 times a day, 3-5 times a week for a duration of 6-12 weeks. Reviewed anatomy using anatomical model and how PFS occurs.  Given rehab exercises handout for VMO, hip abductors, core, entire kinetic chain including proprioception exercises.  Could benefit from PT, regular exercise, upright biking, and a PFS knee brace to assist with tracking abnormalities.  Proper technique shown and discussed handout in great detail with ATC.  All questions were discussed and answered.     Impression and Recommendations:     The above documentation has been reviewed and is accurate and complete Lyndal Pulley, DO

## 2020-01-26 NOTE — Assessment & Plan Note (Signed)
Patient does have what appears to be more patellofemoral arthritis, x-rays pending.  Patient has not taken significant number of medications we discussed vitamin supplementation.  Icing regimen.  Tru pull lite.  Patient will follow up again in 6 weeks worsening pain consider injections or physical therapy

## 2020-01-26 NOTE — Patient Instructions (Addendum)
Xray today Knee brace with activity Good to see you.  Ice 20 minutes 2 times daily. Usually after activity and before bed. Turmeric 500mg  daily  Tart cherry extract 1200mg  at night Vitamin D 2000 IU daily  See me again in 6 weeks

## 2020-03-08 ENCOUNTER — Other Ambulatory Visit: Payer: Self-pay

## 2020-03-08 ENCOUNTER — Ambulatory Visit: Payer: PPO | Admitting: Family Medicine

## 2020-03-08 ENCOUNTER — Encounter: Payer: Self-pay | Admitting: Family Medicine

## 2020-03-08 DIAGNOSIS — M1711 Unilateral primary osteoarthritis, right knee: Secondary | ICD-10-CM

## 2020-03-08 NOTE — Patient Instructions (Signed)
Good to see you We have many modalities at our disposal including PT and injections After discussing home exercises for now until your festivals See me again in 3-4 months

## 2020-03-08 NOTE — Progress Notes (Signed)
[place] [address] [place] Phone: [phone] Subjective:   I Joy Murillo am serving as a Education administrator for Dr. Hulan Murillo.  This visit occurred during the SARS-CoV-2 public health emergency.  Safety protocols were in place, including screening questions prior to the visit, additional usage of staff PPE, and extensive cleaning of exam room while observing appropriate contact time as indicated for disinfecting solutions.   I'm seeing this patient by the request  of:  Joy Small, MD  CC: Knee pain follow-up  MHD:[id number]   11/52/2021 Patient does have what appears to be more patellofemoral arthritis, x-rays pending.  Patient has not taken significant number of medications we discussed vitamin supplementation.  Icing regimen.  Tru pull lite.  Patient will follow up again in 6 weeks worsening pain consider injections or physical therapy   Update 03/08/2020 Joy Murillo is a 69 y.o. female coming in with complaint of right knee pain. Patient states she is doing well. States she really likes the brace. Believes she feels the same but the brace helps. Wants to know what to do to keep things in a good place.  Patient states that she is going to be very busy here.  We will try to do the exercises fairly regularly.  Still notices that stairs seems to be the most concerning portion.  Feels like she has more pain on the anterior aspect of the knee with stairs    Patient did have x-rays at last exam.  X-rays were independently visualized by me showing the patient does have mild to moderate osteoarthritic changes of the medial compartment as well as the patellofemoral.  Past Medical History:  Diagnosis Date  . Allergy    No past surgical history on file. Social History   Socioeconomic History  . Marital status: Married    Spouse name: Not on file  . Number of children: Not on file  . Years of education: Not on file  . Highest  education level: Not on file  Occupational History  . Not on file  Tobacco Use  . Smoking status: Former Research scientist (life sciences)  . Smokeless tobacco: Not on file  Substance and Sexual Activity  . Alcohol use: No  . Drug use: No  . Sexual activity: Never    Birth control/protection: Abstinence  Other Topics Concern  . Not on file  Social History Narrative  . Not on file   Social Determinants of Health   Financial Resource Strain: Not on file  Food Insecurity: Not on file  Transportation Needs: Not on file  Physical Activity: Not on file  Stress: Not on file  Social Connections: Not on file   Allergies  Allergen Reactions  . Adhesive [Tape]   . Flonase [Fluticasone Propionate] Hives   Family History  Problem Relation Age of Onset  . Heart disease Mother   . Cancer Father   . Diabetes Father   . Heart disease Maternal Grandfather   . Cancer Paternal Grandmother   . Cancer Paternal Grandfather   . Breast cancer Neg Hx     Current Outpatient Medications (Endocrine & Metabolic):  .  estradiol (ESTRACE) 0.5 MG tablet, Take 0.5 mg by mouth daily.    Current Outpatient Medications (Analgesics):  .  aspirin 81 MG tablet, Take 81 mg by mouth daily.   Current Outpatient Medications (Other):  Marland Kitchen  Multiple Vitamins-Minerals (MULTIVITAMIN WITH MINERALS) tablet, Take 1 tablet by mouth daily.   Reviewed prior external information  including notes and imaging from  primary care provider As well as notes that were available from care everywhere and other healthcare systems.  Past medical history, social, surgical and family history all reviewed in electronic medical record.  No pertanent information unless stated regarding to the chief complaint.   Review of Systems:  No headache, visual changes, nausea, vomiting, diarrhea, constipation, dizziness, abdominal pain, skin rash, fevers, chills, night sweats, weight loss, swollen lymph nodes, body aches, joint swelling, chest pain, shortness of  breath, mood changes. POSITIVE muscle aches  Objective  Blood pressure 110/70, pulse 67, height 5' 5.5" (1.664 m), weight 142 lb (64.4 kg), SpO2 98 %.   General: No apparent distress alert and oriented x3 mood and affect normal, dressed appropriately.  HEENT: Pupils equal, extraocular movements intact  Respiratory: Patient's speak in full sentences and does not appear short of breath  Cardiovascular: No lower extremity edema, non tender, no erythema  Gait mild antalgic MSK: Knee: Right valgus deformity noted.  Tender to palpation over medial and PF joint line.  ROM full in flexion and extension and lower leg rotation. painful patellar compression. Patellar glide with mild crepitus. Patellar and quadriceps tendons unremarkable. Hamstring and quadriceps strength is normal. Contralateral knee shows minimal arthritic changes    Impression and Recommendations:     The above documentation has been reviewed and is accurate and complete Joy Pulley, DO

## 2020-03-08 NOTE — Assessment & Plan Note (Signed)
Patient does have patellofemoral as well as medial joint arthritis.  We discussed with patient in great length about different treatment options.  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  Patient at this point would like to continue the conservative therapy.  Declined injection physical therapy or even advanced imaging at this point.  Patient would not want any surgical intervention.  Patient will continue the brace, home exercises and icing regimen follow-up with me again in 3 to 4 months

## 2020-04-19 ENCOUNTER — Other Ambulatory Visit: Payer: Self-pay | Admitting: Family Medicine

## 2020-04-19 DIAGNOSIS — Z1231 Encounter for screening mammogram for malignant neoplasm of breast: Secondary | ICD-10-CM

## 2020-04-21 ENCOUNTER — Other Ambulatory Visit: Payer: Self-pay

## 2020-04-21 ENCOUNTER — Ambulatory Visit
Admission: RE | Admit: 2020-04-21 | Discharge: 2020-04-21 | Disposition: A | Discharge status: Home / Self Care | Payer: PPO | Source: Ambulatory Visit

## 2020-04-21 DIAGNOSIS — Z1231 Encounter for screening mammogram for malignant neoplasm of breast: Secondary | ICD-10-CM | POA: Diagnosis not present

## 2020-05-04 DIAGNOSIS — U071 COVID-19: Secondary | ICD-10-CM | POA: Diagnosis not present

## 2020-05-27 DIAGNOSIS — J0100 Acute maxillary sinusitis, unspecified: Secondary | ICD-10-CM | POA: Diagnosis not present

## 2020-10-17 DIAGNOSIS — H43812 Vitreous degeneration, left eye: Secondary | ICD-10-CM | POA: Diagnosis not present

## 2020-10-17 DIAGNOSIS — H2513 Age-related nuclear cataract, bilateral: Secondary | ICD-10-CM | POA: Diagnosis not present

## 2021-01-12 DIAGNOSIS — L578 Other skin changes due to chronic exposure to nonionizing radiation: Secondary | ICD-10-CM | POA: Diagnosis not present

## 2021-01-12 DIAGNOSIS — L814 Other melanin hyperpigmentation: Secondary | ICD-10-CM | POA: Diagnosis not present

## 2021-01-12 DIAGNOSIS — L821 Other seborrheic keratosis: Secondary | ICD-10-CM | POA: Diagnosis not present

## 2021-01-12 DIAGNOSIS — D225 Melanocytic nevi of trunk: Secondary | ICD-10-CM | POA: Diagnosis not present

## 2021-01-12 DIAGNOSIS — Z86018 Personal history of other benign neoplasm: Secondary | ICD-10-CM | POA: Diagnosis not present

## 2021-01-12 DIAGNOSIS — T148XXA Other injury of unspecified body region, initial encounter: Secondary | ICD-10-CM | POA: Diagnosis not present

## 2021-01-12 DIAGNOSIS — D2262 Melanocytic nevi of left upper limb, including shoulder: Secondary | ICD-10-CM | POA: Diagnosis not present

## 2021-01-12 DIAGNOSIS — Z23 Encounter for immunization: Secondary | ICD-10-CM | POA: Diagnosis not present

## 2021-01-16 DIAGNOSIS — Z0000 Encounter for general adult medical examination without abnormal findings: Secondary | ICD-10-CM | POA: Diagnosis not present

## 2021-01-16 DIAGNOSIS — Z136 Encounter for screening for cardiovascular disorders: Secondary | ICD-10-CM | POA: Diagnosis not present

## 2021-01-16 DIAGNOSIS — Z1211 Encounter for screening for malignant neoplasm of colon: Secondary | ICD-10-CM | POA: Diagnosis not present

## 2021-01-16 DIAGNOSIS — R030 Elevated blood-pressure reading, without diagnosis of hypertension: Secondary | ICD-10-CM | POA: Diagnosis not present

## 2021-01-16 DIAGNOSIS — Z13820 Encounter for screening for osteoporosis: Secondary | ICD-10-CM | POA: Diagnosis not present

## 2021-01-16 DIAGNOSIS — Z1231 Encounter for screening mammogram for malignant neoplasm of breast: Secondary | ICD-10-CM | POA: Diagnosis not present

## 2021-01-16 DIAGNOSIS — N951 Menopausal and female climacteric states: Secondary | ICD-10-CM | POA: Diagnosis not present

## 2021-01-16 DIAGNOSIS — J302 Other seasonal allergic rhinitis: Secondary | ICD-10-CM | POA: Diagnosis not present

## 2021-01-16 DIAGNOSIS — Z13220 Encounter for screening for lipoid disorders: Secondary | ICD-10-CM | POA: Diagnosis not present

## 2021-01-25 ENCOUNTER — Other Ambulatory Visit: Payer: Self-pay | Admitting: Family Medicine

## 2021-01-25 DIAGNOSIS — Z13820 Encounter for screening for osteoporosis: Secondary | ICD-10-CM

## 2021-01-25 DIAGNOSIS — Z1231 Encounter for screening mammogram for malignant neoplasm of breast: Secondary | ICD-10-CM

## 2021-07-13 ENCOUNTER — Ambulatory Visit
Admission: RE | Admit: 2021-07-13 | Discharge: 2021-07-13 | Disposition: A | Discharge status: Home / Self Care | Payer: PPO | Source: Ambulatory Visit | Attending: Family Medicine | Admitting: Family Medicine

## 2021-07-13 DIAGNOSIS — Z13820 Encounter for screening for osteoporosis: Secondary | ICD-10-CM

## 2021-07-13 DIAGNOSIS — M85832 Other specified disorders of bone density and structure, left forearm: Secondary | ICD-10-CM | POA: Diagnosis not present

## 2021-07-13 DIAGNOSIS — Z780 Asymptomatic menopausal state: Secondary | ICD-10-CM | POA: Diagnosis not present

## 2021-07-13 DIAGNOSIS — Z1231 Encounter for screening mammogram for malignant neoplasm of breast: Secondary | ICD-10-CM

## 2021-10-04 NOTE — Progress Notes (Unsigned)
    Joy Murillo D.[place] Phone: [phone]   Assessment and Plan:     There are no diagnoses linked to this encounter.  ***   Pertinent previous records reviewed include ***   Follow Up: ***     Subjective:   I, Joy Murillo, am serving as a Education administrator for Doctor Glennon Mac  Chief Complaint: left knee pain   HPI:   10/05/2021 Patient is a 71 year old female complaining of left knee pain. Patient states   Relevant Historical Information: ***  Additional pertinent review of systems negative.   Current Outpatient Medications:    aspirin 81 MG tablet, Take 81 mg by mouth daily., Disp: , Rfl:    estradiol (ESTRACE) 0.5 MG tablet, Take 0.5 mg by mouth daily., Disp: , Rfl:    Multiple Vitamins-Minerals (MULTIVITAMIN WITH MINERALS) tablet, Take 1 tablet by mouth daily., Disp: , Rfl:    Objective:     There were no vitals filed for this visit.    There is no height or weight on file to calculate BMI.    Physical Exam:    ***   Electronically signed by:  Joy Murillo D.[facility] 2:07 PM 10/04/21

## 2021-10-05 ENCOUNTER — Ambulatory Visit: Payer: PPO | Admitting: Sports Medicine

## 2021-10-05 ENCOUNTER — Ambulatory Visit (INDEPENDENT_AMBULATORY_CARE_PROVIDER_SITE_OTHER): Payer: PPO

## 2021-10-05 VITALS — BP 118/70 | HR 66 | Ht 65.0 in | Wt 140.0 lb

## 2021-10-05 DIAGNOSIS — M1712 Unilateral primary osteoarthritis, left knee: Secondary | ICD-10-CM | POA: Diagnosis not present

## 2021-10-05 DIAGNOSIS — G8929 Other chronic pain: Secondary | ICD-10-CM

## 2021-10-05 DIAGNOSIS — M25562 Pain in left knee: Secondary | ICD-10-CM

## 2021-10-05 NOTE — Patient Instructions (Addendum)
Good to see you  Knee HEP  4 week follow up   

## 2021-10-23 DIAGNOSIS — H2513 Age-related nuclear cataract, bilateral: Secondary | ICD-10-CM | POA: Diagnosis not present

## 2021-11-01 NOTE — Progress Notes (Signed)
    Joy Murillo D.[place] Phone: [phone]   Assessment and Plan:     1. Chronic pain of left knee 2. Primary osteoarthritis of left knee  -Chronic with exacerbation, subsequent visit - Significant improvement in flare of left knee pain with underlying MOI after CSI at previous office visit - Encouraged to continue HEP - Tylenol/NSAIDs as needed for day-to-day pain relief  Pertinent previous records reviewed include none   Follow Up: As needed if no improvement or worsening of symptoms.  Could repeat CSI if flare recurs.  Would ideally wait at least 3 months in between injections and could be repeated after 01/05/2022   Subjective:   I, Joy Murillo, am serving as a Education administrator for Doctor Glennon Mac   Chief Complaint: left knee pain    HPI:    10/05/2021 Patient is a 71 year old female complaining of left knee pain. Patient states that she has no MOI, bending causes pain, she is able to straighten her knee the day before yesterday it hurt to walk, just been going on since 09/23/2021, has numbness and tingling but that comes from her low back , has been taking advil and that is okay, tries to avoid meds do to kidney numbers, sometimes she does get locking clicking and popping   11/09/2021 Patient states the [place] worked really well     Relevant Historical Information: None pertinent   Additional pertinent review of systems negative.    Current Outpatient Medications:    aspirin 81 MG tablet, Take 81 mg by mouth daily., Disp: , Rfl:    estradiol (ESTRACE) 0.5 MG tablet, Take 0.5 mg by mouth daily., Disp: , Rfl:    Multiple Vitamins-Minerals (MULTIVITAMIN WITH MINERALS) tablet, Take 1 tablet by mouth daily., Disp: , Rfl:    Objective:     Vitals:   11/09/21 0817  BP: 118/74  Pulse: 60  SpO2: 100%  Weight: 140 lb (63.5 kg)  Height: '5\' 5"'$  (1.651 m)      Body mass index is 23.3 kg/m.     Physical Exam:    General:  awake, alert oriented, no acute distress nontoxic Skin: no suspicious lesions or rashes Neuro:sensation intact, no deficits, strength 5/5 with no deficits, no atrophy, normal muscle tone Psych: No signs of anxiety, depression or other mood disorder  Left knee: No swelling No deformity Neg fluid wave, joint milking ROM Flex 110 , Ext 0  NTTP over the quad tendon, medial fem condyle, lat fem condyle, patella, plica, patella tendon, tibial tuberostiy, fibular head, posterior fossa, pes anserine bursa, gerdy's tubercle, medial jt line, lateral jt line    Gait normal    Electronically signed by:  Joy Murillo D.[facility] 8:36 AM 11/09/21

## 2021-11-09 ENCOUNTER — Ambulatory Visit: Payer: PPO | Admitting: Sports Medicine

## 2021-11-09 VITALS — BP 118/74 | HR 60 | Ht 65.0 in | Wt 140.0 lb

## 2021-11-09 DIAGNOSIS — M1712 Unilateral primary osteoarthritis, left knee: Secondary | ICD-10-CM | POA: Diagnosis not present

## 2021-11-09 DIAGNOSIS — G8929 Other chronic pain: Secondary | ICD-10-CM | POA: Diagnosis not present

## 2021-11-09 DIAGNOSIS — M25562 Pain in left knee: Secondary | ICD-10-CM | POA: Diagnosis not present

## 2021-11-09 NOTE — Patient Instructions (Signed)
Good to see you   

## 2022-01-17 DIAGNOSIS — Z1231 Encounter for screening mammogram for malignant neoplasm of breast: Secondary | ICD-10-CM | POA: Diagnosis not present

## 2022-01-17 DIAGNOSIS — Z7185 Encounter for immunization safety counseling: Secondary | ICD-10-CM | POA: Diagnosis not present

## 2022-01-17 DIAGNOSIS — M8589 Other specified disorders of bone density and structure, multiple sites: Secondary | ICD-10-CM | POA: Diagnosis not present

## 2022-01-17 DIAGNOSIS — Z0000 Encounter for general adult medical examination without abnormal findings: Secondary | ICD-10-CM | POA: Diagnosis not present

## 2022-01-17 DIAGNOSIS — Z8719 Personal history of other diseases of the digestive system: Secondary | ICD-10-CM | POA: Diagnosis not present

## 2022-01-17 DIAGNOSIS — Z86010 Personal history of colonic polyps: Secondary | ICD-10-CM | POA: Diagnosis not present

## 2022-01-17 DIAGNOSIS — N952 Postmenopausal atrophic vaginitis: Secondary | ICD-10-CM | POA: Diagnosis not present

## 2022-01-17 DIAGNOSIS — M1712 Unilateral primary osteoarthritis, left knee: Secondary | ICD-10-CM | POA: Diagnosis not present

## 2022-01-17 DIAGNOSIS — E785 Hyperlipidemia, unspecified: Secondary | ICD-10-CM | POA: Diagnosis not present

## 2022-01-17 DIAGNOSIS — Z1211 Encounter for screening for malignant neoplasm of colon: Secondary | ICD-10-CM | POA: Diagnosis not present

## 2022-01-29 DIAGNOSIS — D2262 Melanocytic nevi of left upper limb, including shoulder: Secondary | ICD-10-CM | POA: Diagnosis not present

## 2022-01-29 DIAGNOSIS — D225 Melanocytic nevi of trunk: Secondary | ICD-10-CM | POA: Diagnosis not present

## 2022-01-29 DIAGNOSIS — Z86018 Personal history of other benign neoplasm: Secondary | ICD-10-CM | POA: Diagnosis not present

## 2022-01-29 DIAGNOSIS — L821 Other seborrheic keratosis: Secondary | ICD-10-CM | POA: Diagnosis not present

## 2022-01-29 DIAGNOSIS — L814 Other melanin hyperpigmentation: Secondary | ICD-10-CM | POA: Diagnosis not present

## 2022-01-29 DIAGNOSIS — L578 Other skin changes due to chronic exposure to nonionizing radiation: Secondary | ICD-10-CM | POA: Diagnosis not present

## 2022-07-03 ENCOUNTER — Other Ambulatory Visit: Payer: Self-pay | Admitting: Family Medicine

## 2022-07-03 DIAGNOSIS — Z1231 Encounter for screening mammogram for malignant neoplasm of breast: Secondary | ICD-10-CM

## 2022-08-16 ENCOUNTER — Ambulatory Visit
Admission: RE | Admit: 2022-08-16 | Discharge: 2022-08-16 | Disposition: A | Discharge status: Home / Self Care | Payer: PPO | Source: Ambulatory Visit | Attending: Family Medicine | Admitting: Family Medicine

## 2022-08-16 DIAGNOSIS — Z1231 Encounter for screening mammogram for malignant neoplasm of breast: Secondary | ICD-10-CM | POA: Diagnosis not present

## 2022-08-31 NOTE — Progress Notes (Unsigned)
    Joy Murillo [facility] [address] [place] [address] Phone: [phone]   Assessment and Plan:     There are no diagnoses linked to this encounter.  ***   Pertinent previous records reviewed include ***   Follow Up: ***     Subjective:   I, Joy Murillo, am serving as a Neurosurgeon for Doctor Richardean Sale   Chief Complaint: left knee pain    HPI:    10/05/2021 Patient is a 72 year old female complaining of left knee pain. Patient states that she has no MOI, bending causes pain, she is able to straighten her knee the day before yesterday it hurt to walk, just been going on since 09/23/2021, has numbness and tingling but that comes from her low back , has been taking advil and that is okay, tries to avoid meds do to kidney numbers, sometimes she does get locking clicking and popping    11/09/2021 Patient states the CSI worked really well    09/03/2022 Patient states   Relevant Historical Information: None pertinent  Additional pertinent review of systems negative.   Current Outpatient Medications:    aspirin 81 MG tablet, Take 81 mg by mouth daily., Disp: , Rfl:    estradiol (ESTRACE) 0.5 MG tablet, Take 0.5 mg by mouth daily., Disp: , Rfl:    Multiple Vitamins-Minerals (MULTIVITAMIN WITH MINERALS) tablet, Take 1 tablet by mouth daily., Disp: , Rfl:    Objective:     There were no vitals filed for this visit.    There is no height or weight on file to calculate BMI.    Physical Exam:    ***   Electronically signed by:  Joy Murillo [facility] 12:51 PM 08/31/22

## 2022-09-03 ENCOUNTER — Ambulatory Visit: Payer: PPO | Admitting: Sports Medicine

## 2022-09-03 VITALS — BP 130/80 | HR 63 | Ht 65.0 in | Wt 138.0 lb

## 2022-09-03 DIAGNOSIS — G8929 Other chronic pain: Secondary | ICD-10-CM

## 2022-09-03 DIAGNOSIS — M1712 Unilateral primary osteoarthritis, left knee: Secondary | ICD-10-CM

## 2022-09-03 DIAGNOSIS — M25562 Pain in left knee: Secondary | ICD-10-CM | POA: Diagnosis not present

## 2022-09-03 NOTE — Patient Instructions (Signed)
Brace for activity  Tylenol as needed  As needed follow up

## 2022-12-03 DIAGNOSIS — H2513 Age-related nuclear cataract, bilateral: Secondary | ICD-10-CM | POA: Diagnosis not present

## 2023-01-23 DIAGNOSIS — N952 Postmenopausal atrophic vaginitis: Secondary | ICD-10-CM | POA: Diagnosis not present

## 2023-01-23 DIAGNOSIS — E785 Hyperlipidemia, unspecified: Secondary | ICD-10-CM | POA: Diagnosis not present

## 2023-01-23 DIAGNOSIS — N951 Menopausal and female climacteric states: Secondary | ICD-10-CM | POA: Diagnosis not present

## 2023-01-23 DIAGNOSIS — Z0000 Encounter for general adult medical examination without abnormal findings: Secondary | ICD-10-CM | POA: Diagnosis not present

## 2023-01-23 DIAGNOSIS — M8580 Other specified disorders of bone density and structure, unspecified site: Secondary | ICD-10-CM | POA: Diagnosis not present

## 2023-01-23 DIAGNOSIS — Z8719 Personal history of other diseases of the digestive system: Secondary | ICD-10-CM | POA: Diagnosis not present

## 2023-01-23 DIAGNOSIS — Z7185 Encounter for immunization safety counseling: Secondary | ICD-10-CM | POA: Diagnosis not present

## 2023-01-23 DIAGNOSIS — Z1211 Encounter for screening for malignant neoplasm of colon: Secondary | ICD-10-CM | POA: Diagnosis not present

## 2023-01-23 DIAGNOSIS — E739 Lactose intolerance, unspecified: Secondary | ICD-10-CM | POA: Diagnosis not present

## 2023-01-23 DIAGNOSIS — Z1231 Encounter for screening mammogram for malignant neoplasm of breast: Secondary | ICD-10-CM | POA: Diagnosis not present

## 2023-02-13 DIAGNOSIS — L814 Other melanin hyperpigmentation: Secondary | ICD-10-CM | POA: Diagnosis not present

## 2023-02-13 DIAGNOSIS — D2262 Melanocytic nevi of left upper limb, including shoulder: Secondary | ICD-10-CM | POA: Diagnosis not present

## 2023-02-13 DIAGNOSIS — Z86018 Personal history of other benign neoplasm: Secondary | ICD-10-CM | POA: Diagnosis not present

## 2023-02-13 DIAGNOSIS — D225 Melanocytic nevi of trunk: Secondary | ICD-10-CM | POA: Diagnosis not present

## 2023-02-13 DIAGNOSIS — L821 Other seborrheic keratosis: Secondary | ICD-10-CM | POA: Diagnosis not present

## 2023-02-13 DIAGNOSIS — L578 Other skin changes due to chronic exposure to nonionizing radiation: Secondary | ICD-10-CM | POA: Diagnosis not present

## 2023-09-05 ENCOUNTER — Other Ambulatory Visit: Payer: Self-pay | Admitting: Internal Medicine

## 2023-09-05 DIAGNOSIS — Z1231 Encounter for screening mammogram for malignant neoplasm of breast: Secondary | ICD-10-CM

## 2023-09-09 ENCOUNTER — Ambulatory Visit
Admission: RE | Admit: 2023-09-09 | Discharge: 2023-09-09 | Disposition: A | Discharge status: Home / Self Care | Source: Ambulatory Visit

## 2023-09-09 DIAGNOSIS — Z1231 Encounter for screening mammogram for malignant neoplasm of breast: Secondary | ICD-10-CM | POA: Diagnosis not present

## 2023-10-18 DIAGNOSIS — E739 Lactose intolerance, unspecified: Secondary | ICD-10-CM | POA: Diagnosis not present

## 2023-10-18 DIAGNOSIS — Z86018 Personal history of other benign neoplasm: Secondary | ICD-10-CM | POA: Diagnosis not present

## 2023-10-18 DIAGNOSIS — Z8719 Personal history of other diseases of the digestive system: Secondary | ICD-10-CM | POA: Diagnosis not present

## 2023-10-18 DIAGNOSIS — M8580 Other specified disorders of bone density and structure, unspecified site: Secondary | ICD-10-CM | POA: Diagnosis not present

## 2023-10-18 DIAGNOSIS — N952 Postmenopausal atrophic vaginitis: Secondary | ICD-10-CM | POA: Diagnosis not present

## 2023-10-18 DIAGNOSIS — E785 Hyperlipidemia, unspecified: Secondary | ICD-10-CM | POA: Diagnosis not present

## 2023-10-18 DIAGNOSIS — M1990 Unspecified osteoarthritis, unspecified site: Secondary | ICD-10-CM | POA: Diagnosis not present

## 2023-10-18 DIAGNOSIS — K5289 Other specified noninfective gastroenteritis and colitis: Secondary | ICD-10-CM | POA: Diagnosis not present

## 2023-11-28 DIAGNOSIS — N952 Postmenopausal atrophic vaginitis: Secondary | ICD-10-CM | POA: Diagnosis not present

## 2023-11-28 DIAGNOSIS — Z79890 Hormone replacement therapy (postmenopausal): Secondary | ICD-10-CM | POA: Diagnosis not present

## 2023-12-11 DIAGNOSIS — Z23 Encounter for immunization: Secondary | ICD-10-CM | POA: Diagnosis not present

## 2023-12-27 DIAGNOSIS — S6991XA Unspecified injury of right wrist, hand and finger(s), initial encounter: Secondary | ICD-10-CM | POA: Diagnosis not present

## 2023-12-27 DIAGNOSIS — S63501A Unspecified sprain of right wrist, initial encounter: Secondary | ICD-10-CM | POA: Diagnosis not present

## 2024-01-23 DIAGNOSIS — Z780 Asymptomatic menopausal state: Secondary | ICD-10-CM | POA: Diagnosis not present

## 2024-01-23 DIAGNOSIS — R7989 Other specified abnormal findings of blood chemistry: Secondary | ICD-10-CM | POA: Diagnosis not present

## 2024-01-23 DIAGNOSIS — E785 Hyperlipidemia, unspecified: Secondary | ICD-10-CM | POA: Diagnosis not present

## 2024-01-30 ENCOUNTER — Other Ambulatory Visit (HOSPITAL_BASED_OUTPATIENT_CLINIC_OR_DEPARTMENT_OTHER): Payer: Self-pay | Admitting: Internal Medicine

## 2024-01-30 DIAGNOSIS — K5289 Other specified noninfective gastroenteritis and colitis: Secondary | ICD-10-CM | POA: Diagnosis not present

## 2024-01-30 DIAGNOSIS — M8580 Other specified disorders of bone density and structure, unspecified site: Secondary | ICD-10-CM | POA: Diagnosis not present

## 2024-01-30 DIAGNOSIS — E785 Hyperlipidemia, unspecified: Secondary | ICD-10-CM | POA: Diagnosis not present

## 2024-01-30 DIAGNOSIS — Z1389 Encounter for screening for other disorder: Secondary | ICD-10-CM | POA: Diagnosis not present

## 2024-01-30 DIAGNOSIS — Z0000 Encounter for general adult medical examination without abnormal findings: Secondary | ICD-10-CM | POA: Diagnosis not present

## 2024-01-30 DIAGNOSIS — Z86018 Personal history of other benign neoplasm: Secondary | ICD-10-CM | POA: Diagnosis not present

## 2024-01-30 DIAGNOSIS — N952 Postmenopausal atrophic vaginitis: Secondary | ICD-10-CM | POA: Diagnosis not present

## 2024-01-30 DIAGNOSIS — R7989 Other specified abnormal findings of blood chemistry: Secondary | ICD-10-CM | POA: Diagnosis not present

## 2024-01-30 DIAGNOSIS — Z1331 Encounter for screening for depression: Secondary | ICD-10-CM | POA: Diagnosis not present

## 2024-02-17 DIAGNOSIS — D225 Melanocytic nevi of trunk: Secondary | ICD-10-CM | POA: Diagnosis not present

## 2024-02-17 DIAGNOSIS — L821 Other seborrheic keratosis: Secondary | ICD-10-CM | POA: Diagnosis not present

## 2024-02-17 DIAGNOSIS — L814 Other melanin hyperpigmentation: Secondary | ICD-10-CM | POA: Diagnosis not present

## 2024-02-17 DIAGNOSIS — L578 Other skin changes due to chronic exposure to nonionizing radiation: Secondary | ICD-10-CM | POA: Diagnosis not present

## 2024-02-17 DIAGNOSIS — D2262 Melanocytic nevi of left upper limb, including shoulder: Secondary | ICD-10-CM | POA: Diagnosis not present

## 2024-02-17 DIAGNOSIS — Z86018 Personal history of other benign neoplasm: Secondary | ICD-10-CM | POA: Diagnosis not present

## 2024-02-18 ENCOUNTER — Ambulatory Visit (HOSPITAL_BASED_OUTPATIENT_CLINIC_OR_DEPARTMENT_OTHER)
Admission: RE | Admit: 2024-02-18 | Discharge: 2024-02-18 | Disposition: A | Discharge status: Home / Self Care | Payer: Self-pay | Source: Ambulatory Visit | Attending: Internal Medicine | Admitting: Internal Medicine

## 2024-02-18 DIAGNOSIS — E785 Hyperlipidemia, unspecified: Secondary | ICD-10-CM | POA: Insufficient documentation

## 2024-03-25 NOTE — Progress Notes (Signed)
 " Cardiology Office Note:  .   Date:  04/01/2024 ID:  Joy  ADDI Murillo, DOB 1950-10-20, MRN [id number] PCP: Valentin Skates, DO  HeartCare Providers Cardiologist:  None   Patient Profile: .      PMH Coronary artery disease CT Calcium score 02/18/24 CAC Score 809 (96th percentile) LM 0, LAD 398, LCx 375, RCA 35.9 Aortic atherosclerosis Former tobacco abuse Quit in her early 30s Hyperlipidemia       History of Present Illness: .   Discussed the use of AI scribe software for clinical note transcription with the patient, who gave verbal consent to proceed.  History of Present Illness Joy Murillo is a very pleasant 73 year old female who presents for evaluation of coronary artery calcification. She had CT calcium score which revealed CAC of 809 (96 percentile) with significant calcification in LAD and LCx and small amount in RCA. She started atorvastatin 20 mg daily in early December 2025. Recent triglycerides were 111 and apolipoprotein B was 85. She has no chest pain, shortness of breath, or palpitations, though she occasionally feels a pulse in her throat. Her family history is notable for maternal myocardial infarctions beginning at age 2 and a grandmother who died suddenly from heart failure. Her sister takes a statin for carotid artery disease.  Over the previous few years, she has reduced processed foods and increased fish and lean meats. She walks her dog daily for about 1.5 miles and occasionally hikes on weekends. She quit smoking in her early thirties.  She does not have history of hypertension or diabetes.  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  Lipid panel 01/23/2024 with total cholesterol 213, triglycerides 111, HDL 100, and LDL 91 Apo B 85  Family history: Her family history includes Arthritis in her mother; Asthma in her daughter, mother, and sister; Cancer in her  paternal grandfather and paternal grandmother; Diabetes in her father; Hearing loss in her father; Heart disease in her maternal grandfather and mother; Hyperlipidemia in her mother; Kidney disease in her mother; Miscarriages / Stillbirths in her mother and sister; Obesity in her daughter; Parkinson's disease in her father; Vision loss in her father.  Mother had MI (3), first at age 35, died age , developed a fib in her 14s Maternal grandmother had CHF at older age, died a few days after diagnosis Sister has carotid artery disease, hyperlipidemia  ASCVD Risk Score: ASCVD (Atherosclerotic Cardiovascular Disease) Risk Algorithm including Known ASCVD from AHA/ACC from [email or web address] on 03/30/2024 ** All calculations should be rechecked by clinician prior to use **  RESULT SUMMARY: 11.6 % Risk of cardiovascular event (coronary or stroke death or non-fatal MI or stroke) in next 10 years.  Moderate- to high-intensity statin recommended because 10-year risk >7.5%  To view statin dosages by intensity, see Evidence section.  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  No results found for: LIPOA   ROS: See HPI       Studies Reviewed: SABRA   EKG Interpretation Date/Time:  Monday March 30 2024 09:14:51 EST Ventricular Rate:  84 PR Interval:  146 QRS Duration:  86 QT Interval:  368 QTC Calculation: 434 R Axis:   32  Text Interpretation: Normal sinus rhythm Possible Left atrial enlargement No  previous ECGs available Confirmed by Percy Browning [phone]) on 03/30/2024 9:29:48 AM      Risk Assessment/Calculations:          Physical Exam:   VS: BP 138/72   Pulse 84   Ht 5' 5 (1.651 m)   Wt 133 lb 11.2 oz (60.6 kg)   SpO2 98%   BMI 22.25 kg/m   Wt Readings from Last 3 Encounters:  03/30/24 133 lb 11.2 oz (60.6 kg)  09/03/22 138 lb (62.6 kg)  11/09/21 140 lb (63.5 kg)     GEN: Well nourished, well developed in no acute distress NECK: No JVD; No carotid bruits CARDIAC: RRR, no murmurs, rubs, gallops RESPIRATORY:  Clear to auscultation without rales, wheezing or rhonchi  ABDOMEN: Soft, non-tender, non-distended EXTREMITIES:  No edema; No deformity     ASSESSMENT AND PLAN: .    Assessment & Plan Coronary artery disease Cardiac risk Aortic atherosclerosis Elevated CT calcium score with significant calcification in the left anterior descending and left circumflex arteries. Further testing is needed to assess plaque distribution and type.  She walks for exercise and is not experiencing chest pain, dyspnea, or other symptoms concerning for angina.  Additional risk factors include hyperlipidemia and family history.  No history of hypertension or diabetes.  She quit smoking proximately 40 years ago. No significant findings on EKG today. Due to significant calcification and additional risk factors, we will proceed with ischemia evaluation. - Coronary CTA for mapping of coronary anatomy and evaluation of ischemia - Lopressor  50 mg 2 hours prior - BMET today to evaluate renal function - Continue aspirin, atorvastatin - Heart healthy diet avoiding processed foods, saturated fat, sugar, and other simple carbohydrates encouraged - Be as physically active as possible every day and aim for at least 150 minutes of moderate intensity exercise each week - Aim to participate in weightlifting/resistance training 3 days/week for 30 minutes each session  Hyperlipidemia LDL goal < 55 Lipid  panel 01/23/2024 with total cholesterol 213, triglycerides 111, HDL 100, and LDL 91. Apo B was 85.  LDL, triglycerides, and apolipoprotein B are elevated. Goal LDL 55 mg/dL or lower, triglycerides to under 100 mg/dL, and ApoB to less than 70 mg/dL. Thought high HDL would limit ASCVD. She started atorvastatin 02/24/2024.  She is concerned about liver enzyme elevation on statin therapy.  We will recheck. - Continue atorvastatin 20 mg daily - Repeat lipid panel, Apo B, LFT in early February for surveillance since starting atorvastatin  - Heart healthy diet and regular exercise emphasized  Elevated BP History of white coat hypertension. BP initially elevated but improved on my recheck.  She has recorded BPs at home which are well-controlled.   - No indication for consideration of antihypertensive therapy     Dispo: 3 months with me  Signed, Browning Percy, NP-C "

## 2024-03-30 ENCOUNTER — Encounter (HOSPITAL_BASED_OUTPATIENT_CLINIC_OR_DEPARTMENT_OTHER): Payer: Self-pay | Admitting: Nurse Practitioner

## 2024-03-30 ENCOUNTER — Institutional Professional Consult (permissible substitution) (HOSPITAL_BASED_OUTPATIENT_CLINIC_OR_DEPARTMENT_OTHER): Admitting: Nurse Practitioner

## 2024-03-30 VITALS — BP 138/72 | HR 84 | Ht 65.0 in | Wt 133.7 lb

## 2024-03-30 DIAGNOSIS — E785 Hyperlipidemia, unspecified: Secondary | ICD-10-CM

## 2024-03-30 DIAGNOSIS — Z7189 Other specified counseling: Secondary | ICD-10-CM | POA: Diagnosis not present

## 2024-03-30 DIAGNOSIS — R030 Elevated blood-pressure reading, without diagnosis of hypertension: Secondary | ICD-10-CM

## 2024-03-30 DIAGNOSIS — I2510 Atherosclerotic heart disease of native coronary artery without angina pectoris: Secondary | ICD-10-CM

## 2024-03-30 DIAGNOSIS — I700 Atherosclerosis of aorta: Secondary | ICD-10-CM

## 2024-03-30 LAB — BASIC METABOLIC PANEL WITH GFR
BUN/Creatinine Ratio: 15 (ref 12–28)
BUN: 14 mg/dL (ref 8–27)
CO2: 25 mmol/L (ref 20–29)
Calcium: 10 mg/dL (ref 8.7–10.3)
Chloride: 101 mmol/L (ref 96–106)
Creatinine, Ser: 0.95 mg/dL (ref 0.57–1.00)
Glucose: 92 mg/dL (ref 70–99)
Potassium: 5 mmol/L (ref 3.5–5.2)
Sodium: 141 mmol/L (ref 134–144)
eGFR: 64 mL/min/1.73

## 2024-03-30 MED ORDER — METOPROLOL TARTRATE 50 MG PO TABS: 50.0000 mg | ORAL_TABLET | Freq: Once | ORAL | 0 refills | Status: AC

## 2024-03-30 NOTE — Patient Instructions (Signed)
 Medication Instructions:   Your physician recommends that you continue on your current medications as directed. Please refer to the Current Medication list given to you today.   *If you need a refill on your cardiac medications before your next appointment, please call your pharmacy*  Lab Work:  TODAY!!!! BMET    Your physician recommends that you return for a FASTING NMR/LPA LFT/APOLIPO B fasting after midnight in February. I will mail you the paperwork.   If you have labs (blood work) drawn today and your tests are completely normal, you will receive your results only by: MyChart Message (if you have MyChart) OR A paper copy in the mail If you have any lab test that is abnormal or we need to change your treatment, we will call you to review the results.  Testing/Procedures:    Your cardiac CT will be scheduled at one of the below locations:   [facility] [address]  [place], [place] [address]   If scheduled at the [facility] at [address], please enter the parking lot using the [address] entrance and use the FREE valet service at the patient drop-off area. Enter the building and check-in with registration on the main floor.   Please follow these instructions carefully (unless otherwise directed):  An IV will be required for this test and Nitroglycerin will be given.   On the Night Before the Test: Be sure to Drink plenty of water. Do not consume any caffeinated/decaffeinated beverages or chocolate 12 hours prior to your test. Do not take any antihistamines 12 hours prior to your test.  On the Day of the Test: Drink plenty of water until 1 hour prior to the test. Do not eat any food 1 hour prior to test. You may take your regular medications prior to the test.  Take metoprolol  (Lopressor ) one ( 1) tablet by mouth ( 50 mg) two hours prior to test. FEMALES- please wear underwire-free bra if available, avoid dresses  & tight clothing     After the Test: Drink plenty of water. After receiving IV contrast, you may experience a mild flushed feeling. This is normal. On occasion, you may experience a mild rash up to 24 hours after the test. This is not dangerous. If this occurs, you can take Benadryl 25 mg, Zyrtec, Claritin, or Allegra and increase your fluid intake. (Patients taking Tikosyn should avoid Benadryl, and may take Zyrtec, Claritin, or Allegra) If you experience trouble breathing, this can be serious. If it is severe call 911 IMMEDIATELY. If it is mild, please call our office.  We will call to schedule your test 2-4 weeks out understanding that some insurance companies will need an authorization prior to the service being performed.   For more information and frequently asked questions, please visit our website : [email or web address]  For non-scheduling related questions, please contact the cardiac imaging nurse navigator should you have any questions/concerns: Cardiac Imaging Nurse Navigators Direct Office Dial: [phone]   For scheduling needs, including cancellations and rescheduling, please call [place]any, [phone].   Follow-Up: At [facility], you and your health needs are our priority.  As part of our continuing mission to provide you with exceptional heart care, our providers are all part of one team.  This team includes your primary Cardiologist (physician) and Advanced Practice Providers or APPs (Physician Assistants and Nurse Practitioners) who all work together to provide you with the care you need, when you need it.  Your next appointment:   3 month(s)  Provider:   Rosaline Bane, NP    We recommend signing up for the patient portal called MyChart.  Sign up information is provided on this After Visit Summary.  MyChart is used to connect with patients for Virtual Visits (Telemedicine).  Patients are able to view lab/test results, encounter notes,  upcoming appointments, etc.  Non-urgent messages can be sent to your provider as well.   To learn more about what you can do with MyChart, go to [email or web address].

## 2024-03-31 ENCOUNTER — Ambulatory Visit (HOSPITAL_BASED_OUTPATIENT_CLINIC_OR_DEPARTMENT_OTHER): Payer: Self-pay | Admitting: Nurse Practitioner

## 2024-04-01 ENCOUNTER — Encounter (HOSPITAL_BASED_OUTPATIENT_CLINIC_OR_DEPARTMENT_OTHER): Payer: Self-pay | Admitting: Nurse Practitioner

## 2024-04-08 ENCOUNTER — Encounter (HOSPITAL_COMMUNITY): Payer: Self-pay

## 2024-04-10 ENCOUNTER — Ambulatory Visit (HOSPITAL_COMMUNITY)
Admission: RE | Admit: 2024-04-10 | Discharge: 2024-04-10 | Disposition: A | Discharge status: Home / Self Care | Source: Ambulatory Visit | Attending: Cardiovascular Disease | Admitting: Cardiovascular Disease

## 2024-04-10 ENCOUNTER — Other Ambulatory Visit (HOSPITAL_COMMUNITY)

## 2024-04-10 ENCOUNTER — Other Ambulatory Visit: Payer: Self-pay | Admitting: Cardiovascular Disease

## 2024-04-10 ENCOUNTER — Ambulatory Visit (HOSPITAL_COMMUNITY)
Admission: RE | Admit: 2024-04-10 | Discharge: 2024-04-10 | Disposition: A | Discharge status: Home / Self Care | Source: Ambulatory Visit | Attending: Nurse Practitioner | Admitting: Nurse Practitioner

## 2024-04-10 DIAGNOSIS — R931 Abnormal findings on diagnostic imaging of heart and coronary circulation: Secondary | ICD-10-CM

## 2024-04-10 DIAGNOSIS — I700 Atherosclerosis of aorta: Secondary | ICD-10-CM | POA: Diagnosis not present

## 2024-04-10 DIAGNOSIS — E785 Hyperlipidemia, unspecified: Secondary | ICD-10-CM | POA: Diagnosis not present

## 2024-04-10 DIAGNOSIS — I2510 Atherosclerotic heart disease of native coronary artery without angina pectoris: Secondary | ICD-10-CM | POA: Insufficient documentation

## 2024-04-10 MED ORDER — NITROGLYCERIN 0.4 MG SL SUBL
0.8000 mg | SUBLINGUAL_TABLET | Freq: Once | SUBLINGUAL | Status: AC
  Administered 2024-04-10: 0.8 mg via SUBLINGUAL

## 2024-04-10 MED ORDER — IOHEXOL 350 MG/ML SOLN
100.0000 mL | Freq: Once | INTRAVENOUS | Status: AC | PRN
  Administered 2024-04-10: 100 mL via INTRAVENOUS

## 2024-07-13 ENCOUNTER — Ambulatory Visit (HOSPITAL_BASED_OUTPATIENT_CLINIC_OR_DEPARTMENT_OTHER): Admitting: Nurse Practitioner
